# Patient Record
Sex: Female | Born: 1945
Health system: Southern US, Community
[De-identification: ages and names within clinical notes are randomized; demographics above are authoritative.]

## PROBLEM LIST (undated history)

## (undated) DIAGNOSIS — J309 Allergic rhinitis, unspecified: Secondary | ICD-10-CM

## (undated) DIAGNOSIS — I341 Nonrheumatic mitral (valve) prolapse: Secondary | ICD-10-CM

## (undated) DIAGNOSIS — T7840XA Allergy, unspecified, initial encounter: Secondary | ICD-10-CM

## (undated) DIAGNOSIS — I10 Essential (primary) hypertension: Secondary | ICD-10-CM

## (undated) HISTORY — DX: Nonrheumatic mitral (valve) prolapse: I34.1

## (undated) HISTORY — DX: Allergic rhinitis, unspecified: J30.9

## (undated) HISTORY — DX: Allergy, unspecified, initial encounter: T78.40XA

---
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
## 1974-02-14 HISTORY — PX: CERVICAL CONE BIOPSY: SUR198

## 2000-05-01 ENCOUNTER — Other Ambulatory Visit: Admission: RE | Admit: 2000-05-01 | Discharge: 2000-05-01 | Payer: Self-pay | Admitting: Gynecology

## 2005-02-14 HISTORY — PX: HEMORRHOID SURGERY: SHX153

## 2005-08-15 ENCOUNTER — Ambulatory Visit: Payer: Self-pay | Admitting: Family Medicine

## 2005-09-15 ENCOUNTER — Ambulatory Visit: Payer: Self-pay | Admitting: Family Medicine

## 2005-09-22 ENCOUNTER — Other Ambulatory Visit: Admission: RE | Admit: 2005-09-22 | Discharge: 2005-09-22 | Payer: Self-pay | Admitting: Family Medicine

## 2005-09-22 ENCOUNTER — Ambulatory Visit: Payer: Self-pay | Admitting: Family Medicine

## 2005-10-06 LAB — HM MAMMOGRAPHY: HM Mammogram: NORMAL

## 2005-10-12 ENCOUNTER — Ambulatory Visit: Payer: Self-pay | Admitting: Gastroenterology

## 2005-12-25 LAB — HM COLONOSCOPY

## 2006-01-17 ENCOUNTER — Ambulatory Visit: Payer: Self-pay | Admitting: Family Medicine

## 2006-11-03 ENCOUNTER — Ambulatory Visit: Payer: Self-pay | Admitting: Family Medicine

## 2006-11-21 ENCOUNTER — Ambulatory Visit: Payer: Self-pay | Admitting: Family Medicine

## 2007-11-15 ENCOUNTER — Ambulatory Visit: Payer: Self-pay | Admitting: Family Medicine

## 2008-11-18 ENCOUNTER — Ambulatory Visit: Payer: Self-pay | Admitting: Family Medicine

## 2009-01-22 ENCOUNTER — Ambulatory Visit: Payer: Self-pay | Admitting: Family Medicine

## 2010-01-05 ENCOUNTER — Ambulatory Visit: Payer: Self-pay | Admitting: Family Medicine

## 2010-12-02 ENCOUNTER — Other Ambulatory Visit (INDEPENDENT_AMBULATORY_CARE_PROVIDER_SITE_OTHER): Payer: Medicare Other

## 2010-12-02 DIAGNOSIS — Z23 Encounter for immunization: Secondary | ICD-10-CM

## 2011-05-06 ENCOUNTER — Encounter: Payer: Self-pay | Admitting: Internal Medicine

## 2011-05-16 ENCOUNTER — Other Ambulatory Visit (HOSPITAL_COMMUNITY)
Admission: RE | Admit: 2011-05-16 | Discharge: 2011-05-16 | Disposition: A | Discharge status: Home / Self Care | Payer: Medicare Other | Source: Ambulatory Visit | Attending: Family Medicine | Admitting: Family Medicine

## 2011-05-16 ENCOUNTER — Ambulatory Visit (INDEPENDENT_AMBULATORY_CARE_PROVIDER_SITE_OTHER): Payer: Medicare Other | Admitting: Family Medicine

## 2011-05-16 ENCOUNTER — Encounter: Payer: Self-pay | Admitting: Family Medicine

## 2011-05-16 VITALS — BP 170/86 | HR 72 | Ht 62.0 in | Wt 163.0 lb

## 2011-05-16 DIAGNOSIS — R5383 Other fatigue: Secondary | ICD-10-CM

## 2011-05-16 DIAGNOSIS — Z0000 Encounter for general adult medical examination without abnormal findings: Secondary | ICD-10-CM

## 2011-05-16 DIAGNOSIS — M8580 Other specified disorders of bone density and structure, unspecified site: Secondary | ICD-10-CM

## 2011-05-16 DIAGNOSIS — Z13220 Encounter for screening for lipoid disorders: Secondary | ICD-10-CM

## 2011-05-16 DIAGNOSIS — R202 Paresthesia of skin: Secondary | ICD-10-CM

## 2011-05-16 DIAGNOSIS — M899 Disorder of bone, unspecified: Secondary | ICD-10-CM

## 2011-05-16 DIAGNOSIS — Z23 Encounter for immunization: Secondary | ICD-10-CM

## 2011-05-16 DIAGNOSIS — R209 Unspecified disturbances of skin sensation: Secondary | ICD-10-CM

## 2011-05-16 DIAGNOSIS — IMO0001 Reserved for inherently not codable concepts without codable children: Secondary | ICD-10-CM

## 2011-05-16 DIAGNOSIS — R32 Unspecified urinary incontinence: Secondary | ICD-10-CM

## 2011-05-16 DIAGNOSIS — R5381 Other malaise: Secondary | ICD-10-CM

## 2011-05-16 DIAGNOSIS — Z124 Encounter for screening for malignant neoplasm of cervix: Secondary | ICD-10-CM | POA: Insufficient documentation

## 2011-05-16 DIAGNOSIS — R030 Elevated blood-pressure reading, without diagnosis of hypertension: Secondary | ICD-10-CM

## 2011-05-16 LAB — CBC WITH DIFFERENTIAL/PLATELET
Basophils Absolute: 0 10*3/uL (ref 0.0–0.1)
Eosinophils Relative: 1 % (ref 0–5)
Lymphocytes Relative: 21 % (ref 12–46)
Lymphs Abs: 1.3 10*3/uL (ref 0.7–4.0)
MCV: 95.7 fL (ref 78.0–100.0)
Neutrophils Relative %: 70 % (ref 43–77)
Platelets: 274 10*3/uL (ref 150–400)
RBC: 4.37 MIL/uL (ref 3.87–5.11)
RDW: 13.1 % (ref 11.5–15.5)
WBC: 6.2 10*3/uL (ref 4.0–10.5)

## 2011-05-16 LAB — POCT URINALYSIS DIPSTICK
Glucose, UA: NEGATIVE
Urobilinogen, UA: NEGATIVE

## 2011-05-16 LAB — COMPREHENSIVE METABOLIC PANEL
ALT: 16 U/L (ref 0–35)
AST: 18 U/L (ref 0–37)
Albumin: 4.6 g/dL (ref 3.5–5.2)
Alkaline Phosphatase: 64 U/L (ref 39–117)
BUN: 18 mg/dL (ref 6–23)
Chloride: 104 mEq/L (ref 96–112)
Creat: 0.98 mg/dL (ref 0.50–1.10)
Potassium: 4.3 mEq/L (ref 3.5–5.3)

## 2011-05-16 NOTE — Progress Notes (Signed)
done

## 2011-05-16 NOTE — Progress Notes (Signed)
Susan Dean is a 66 y.o. female who presents for a complete physical.  She has the following concerns:  Having a leaking urinary leakage. Brown spots on her skin. UA showed 1+ leuks and trace blood. Tingling sensation in her right thigh x 3-4 months. Fatty deposit pockets wants you to look at and wants to know what she can do about it.  Sinus drainage with weather changes.  Coricidin works well for her, but if she doesn't "catch it in time", turns into bronchitis.  Urinary leakage--gradual, over several months.  Denies dysuria.  Leakage both with coughing/sneezing, and if bladder is full.  Tingling R lateral thigh.  Seems to be somewhat positional, worse when lying flat, better if leg propped up.  H/o some chronic mild back problems.  Only rarely gets stabbing pains in lower leg  Other than coming here for flu shots, she hasn't been seen in this office since 9/08, last CPE in 8/07  Health Maintenance Immunization History  Administered Date(s) Administered  . Influenza Split 11/18/2008, 01/05/2010, 12/02/2010  . Td 05/15/2005  . Zoster 01/22/2009   Last Pap smear: 2007 Last mammogram: 8/07 Last colonoscopy:12/25/05--hyperplastic polyp and internal hemorrhoids (treated with rubber band ligation) Last DEXA: she believes she had one approx 2004 or 2007, reportedly normal (no records in chart), although she thinks there may have been some thinning.  This was ordered through her GYN (so results are likely there).  She no longer sees them Dentist: not for 2 years Ophtho: 2 years ago Exercise:  None now, due to her busy work schedule  Past Medical History  Diagnosis Date  . Mitral valve prolapse 12/56  . Allergic rhinitis, cause unspecified     Past Surgical History  Procedure Date  . Cervical cone biopsy 75    [place]  . Hemorrhoid surgery 2007    rubber band ligation and infrared coagulation of internal hemorrhoids    History   Social History  . Marital Status: Married   Spouse Name: N/A    Number of Children: 3  . Years of Education: N/A   Occupational History  . 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 Smoking status: Never Smoker   . Smokeless tobacco: Never Used  . Alcohol Use: Yes     very seldom  . Drug Use: No  . Sexually Active: Yes -- Female partner(s)   Other Topics Concern  . Not on file   Social History Narrative   Lives with husband.  2 children live in [place], 1 in [place].     Family History  Problem Relation Age of Onset  . Heart disease Mother   . Hypertension Mother   . Diabetes Mother   . Heart disease Father     CABG in mid-late 60's  . Hypertension Father   . Bipolar disorder Daughter   . Thyroid disease Daughter   . Cancer Maternal Aunt     breast cancer  . Hypertension Paternal Aunt     Current outpatient prescriptions:Ascorbic Acid (VITAMIN C DROPS MT), Use as directed 1 drop in the mouth or throat daily., Disp: , Rfl: ;  Calcium Carbonate-Vitamin D 600-400 MG-UNIT per chew tablet, Chew 2 tablets by mouth daily., Disp: , Rfl: ;  Dextromethorphan-Guaifenesin (CORICIDIN HBP CONGESTION/COUGH) 10-200 MG CAPS, Take 1 capsule by mouth 4 (four) times daily as needed., Disp: , Rfl:  Multiple Vitamins-Minerals (MULTIVITAMIN WITH MINERALS) tablet, Take 1 tablet by mouth daily., Disp: ,  Rfl:   Allergies  Allergen Reactions  . Erythrocin Other (See Comments)    Swelling and puffiness.   ROS:  The patient denies anorexia, fever, weight changes, headaches,  vision changes, decreased hearing, ear pain, sore throat, breast concerns, chest pain, palpitations, dizziness, syncope, dyspnea on exertion, cough, swelling, nausea, vomiting, diarrhea, constipation, abdominal pain, melena, hematochezia, indigestion/heartburn, hematuria, dysuria, vaginal bleeding, discharge, odor or itch, genital lesions, joint pains, numbness, tingling, weakness, tremor, suspicious skin lesions, depression, anxiety,  abnormal bleeding/bruising, or enlarged lymph nodes. See HPI/chief complaint.  PHYSICAL EXAM: BP 162/94  Pulse 72  Ht 5\' 2"  (1.575 m)  Wt 163 lb (73.936 kg)  BMI 29.81 kg/m2 170/86 on repeat by MD General Appearance:    Alert, cooperative, no distress, appears stated age, talkative  Head:    Normocephalic, without obvious abnormality, atraumatic  Eyes:    PERRL, conjunctiva/corneas clear, EOM's intact, fundi    benign  Ears:    Normal TM's and external ear canals  Nose:   Nares normal, mucosa normal, no drainage or sinus   tenderness  Throat:   Lips, mucosa, and tongue normal; teeth and gums normal  Neck:   Supple, no lymphadenopathy;  thyroid:  no   enlargement/tenderness/nodules; no carotid   bruit or JVD  Back:    Spine nontender, no curvature, ROM normal, no CVA     tenderness  Lungs:     Clear to auscultation bilaterally without wheezes, rales or     ronchi; respirations unlabored  Chest Wall:    No tenderness or deformity   Heart:    Regular rate and rhythm, S1 and S2 normal, no murmur, rub   or gallop  Breast Exam:    No tenderness, masses, or nipple discharge or inversion.      No axillary lymphadenopathy  Abdomen:     Soft, non-tender, nondistended, normoactive bowel sounds,    no masses, no hepatosplenomegaly  Genitalia:    Normal external genitalia without lesions.  BUS and vagina normal; cervix without lesions, or cervical motion tenderness, very small cervical polyp at os (right side). No abnormal vaginal discharge.  Uterus and adnexa not enlarged, nontender, no masses.  Pap performed  Rectal:    Normal tone, no masses or tenderness; guaiac negative stool  Extremities:   No clubbing, cyanosis or edema  Pulses:   2+ and symmetric all extremities  Skin:   Skin color, texture, turgor normal, no rashes or lesions. Fatty subcutaneous masses both arms, soft, nontender.  Many seborrheic keratoses, noninflamed, and angiomas throughout.   Lymph nodes:   Cervical, supraclavicular,  and axillary nodes normal  Neurologic:   CNII-XII intact, normal strength, sensation and gait; reflexes 2+ and symmetric throughout          Psych:   Normal mood, affect, hygiene and grooming.    ASSESSMENT/PLAN: 1. Routine general medical examination at a health care facility  POCT Urinalysis Dipstick, Visual acuity screening, Cytology - PAP  2. Paresthesias  Comprehensive metabolic panel, TSH, Vitamin B12   suspect meralgia paresthetica  3. Screening for lipoid disorders    4. Other malaise and fatigue  CBC with Differential, Comprehensive metabolic panel, TSH, Vitamin B12  5. Need for Tdap vaccination  Tdap vaccine greater than or equal to 7yo IM  6. Need for pneumococcal vaccination  Pneumococcal polysaccharide vaccine 23-valent greater than or equal to 2yo subcutaneous/IM  7. Osteopenia  Vitamin D 25 hydroxy  8. Medicare annual wellness visit, initial    9. Incontinence  Urine Culture  10. Elevated BP     Elevated BP--low sodium diet, check BP elsewhere. Regular exercise, weight loss.  F/u if bp's remain elevated  Paresthesias--probably meralgia paresthetica, although can't r/o lumbar radiculopathy given h/o back problems. Check routine labs.  Urinary incontinence, mixed: Kegels, behavioral techniques reviewed in detail Send urine culture to r/o infection.  Get records from GYN with last DEXA results to decide if repeat is needed.  If any osteopenia present, then recommend repeat.  Discussed monthly self breast exams and yearly mammograms (she is past due); at least 30 minutes of aerobic activity at least 5 days/week; proper sunscreen use reviewed; healthy diet, including goals of calcium and vitamin D intake and alcohol recommendations (less than or equal to 1 drink/day) reviewed; regular seatbelt use; changing batteries in smoke detectors.  Immunization recommendations discussed--tdap and pneumovax given.  Colonoscopy recommendations reviewed--hemoccult kit given.  Colonoscopy  UTD

## 2011-05-16 NOTE — Patient Instructions (Addendum)
HEALTH MAINTENANCE RECOMMENDATIONS:  It is recommended that you get at least 30 minutes of aerobic exercise at least 5 days/week (for weight loss, you may need as much as 60-90 minutes). This can be any activity that gets your heart rate up. This can be divided in 10-15 minute intervals if needed, but try and build up your endurance at least once a week.  Weight bearing exercise is also recommended twice weekly.  Eat a healthy diet with lots of vegetables, fruits and fiber.  "Colorful" foods have a lot of vitamins (ie green vegetables, tomatoes, red peppers, etc).  Limit sweet tea, regular sodas and alcoholic beverages, all of which has a lot of calories and sugar.  Up to 1 alcoholic drink daily may be beneficial for women (unless trying to lose weight, watch sugars).  Drink a lot of water.  Calcium recommendations are 1200-1500 mg daily (1500 mg for postmenopausal women or women without ovaries), and vitamin D 1000 IU daily.  This should be obtained from diet and/or supplements (vitamins), and calcium should not be taken all at once, but in divided doses.  Monthly self breast exams and yearly mammograms for women over the age of 36 is recommended.  Sunscreen of at least SPF 30 should be used on all sun-exposed parts of the skin when outside between the hours of 10 am and 4 pm (not just when at beach or pool, but even with exercise, golf, tennis, and yard work!)  Use a sunscreen that says "broad spectrum" so it covers both UVA and UVB rays, and make sure to reapply every 1-2 hours.  Remember to change the batteries in your smoke detectors when changing your clock times in the spring and fall.  Use your seat belt every time you are in a car, and please drive safely and not be distracted with cell phones and texting while driving.  Please check your blood pressure elsewhere, write down, and follow up here if your BP remains >140/90.  Ideally, should be <130/80.  2 Gram Low Sodium Diet A 2 gram  sodium diet restricts the amount of sodium in the diet to no more than 2 g or 2000 mg daily. Limiting the amount of sodium is often used to help lower blood pressure. It is important if you have heart, liver, or kidney problems. Many foods contain sodium for flavor and sometimes as a preservative. When the amount of sodium in a diet needs to be low, it is important to know what to look for when choosing foods and drinks. The following includes some information and guidelines to help make it easier for you to adapt to a low sodium diet. QUICK TIPS  Do not add salt to food.   Avoid convenience items and fast food.   Choose unsalted snack foods.   Buy lower sodium products, often labeled as "lower sodium" or "no salt added."   Check food labels to learn how much sodium is in 1 serving.   When eating at a restaurant, ask that your food be prepared with less salt or none, if possible.  READING FOOD LABELS FOR SODIUM INFORMATION The nutrition facts label is a good place to find how much sodium is in foods. Look for products with no more than 500 to 600 mg of sodium per meal and no more than 150 mg per serving. Remember that 2 g = 2000 mg. The food label may also list foods as:  Sodium-free: Less than 5 mg in a serving.  Very low sodium: 35 mg or less in a serving.   Low-sodium: 140 mg or less in a serving.   Light in sodium: 50% less sodium in a serving. For example, if a food that usually has 300 mg of sodium is changed to become light in sodium, it will have 150 mg of sodium.   Reduced sodium: 25% less sodium in a serving. For example, if a food that usually has 400 mg of sodium is changed to reduced sodium, it will have 300 mg of sodium.  CHOOSING FOODS Grains  Avoid: Salted crackers and snack items. Some cereals, including instant hot cereals. Bread stuffing and biscuit mixes. Seasoned rice or pasta mixes.   Choose: Unsalted snack items. Low-sodium cereals, oats, puffed wheat and  rice, shredded wheat. English muffins and bread. Pasta.  Meats  Avoid: Salted, canned, smoked, spiced, pickled meats, including fish and poultry. Bacon, ham, sausage, cold cuts, hot dogs, anchovies.   Choose: Low-sodium canned tuna and salmon. Fresh or frozen meat, poultry, and fish.  Dairy  Avoid: Processed cheese and spreads. Cottage cheese. Buttermilk and condensed milk. Regular cheese.   Choose: Milk. Low-sodium cottage cheese. Yogurt. Sour cream. Low-sodium cheese.  Fruits and Vegetables  Avoid: Regular canned vegetables. Regular canned tomato sauce and paste. Frozen vegetables in sauces. Olives. Rosita Fire. Relishes. Sauerkraut.   Choose: Low-sodium canned vegetables. Low-sodium tomato sauce and paste. Frozen or fresh vegetables. Fresh and frozen fruit.  Condiments  Avoid: Canned and packaged gravies. Worcestershire sauce. Tartar sauce. Barbecue sauce. Soy sauce. Steak sauce. Ketchup. Onion, garlic, and table salt. Meat flavorings and tenderizers.   Choose: Fresh and dried herbs and spices. Low-sodium varieties of mustard and ketchup. Lemon juice. Tabasco sauce. Horseradish.  SAMPLE 2 GRAM SODIUM MEAL PLAN Breakfast / Sodium (mg)  1 cup low-fat milk / 143 mg   2 slices whole-wheat toast / 270 mg   1 tbs heart-healthy margarine / 153 mg   1 hard-boiled egg / 139 mg   1 small orange / 0 mg  Lunch / Sodium (mg)  1 cup raw carrots / 76 mg    cup hummus / 298 mg   1 cup low-fat milk / 143 mg    cup red grapes / 2 mg   1 whole-wheat pita bread / 356 mg  Dinner / Sodium (mg)  1 cup whole-wheat pasta / 2 mg   1 cup low-sodium tomato sauce / 73 mg   3 oz lean ground beef / 57 mg   1 small side salad (1 cup raw spinach leaves,  cup cucumber,  cup yellow bell pepper) with 1 tsp olive oil and 1 tsp red wine vinegar / 25 mg  Snack / Sodium (mg)  1 container low-fat vanilla yogurt / 107 mg   3 graham cracker squares / 127 mg  Nutrient Analysis  Calories: 2033    Protein: 77 g   Carbohydrate: 282 g   Fat: 72 g   Sodium: 1971 mg  Document Released: 01/31/2005 Document Revised: 01/20/2011 Document Reviewed: 05/04/2009 [facility] Patient Information 2012 [place], [place].

## 2011-05-17 LAB — VITAMIN D 25 HYDROXY (VIT D DEFICIENCY, FRACTURES): Vit D, 25-Hydroxy: 28 ng/mL — ABNORMAL LOW (ref 30–89)

## 2011-05-18 ENCOUNTER — Other Ambulatory Visit: Payer: Self-pay | Admitting: *Deleted

## 2011-05-18 DIAGNOSIS — N390 Urinary tract infection, site not specified: Secondary | ICD-10-CM

## 2011-05-18 LAB — URINE CULTURE: Colony Count: >=100000

## 2011-05-18 MED ORDER — NITROFURANTOIN MONOHYD MACRO 100 MG PO CAPS: 100.0000 mg | ORAL_CAPSULE | Freq: Two times a day (BID) | ORAL | Status: DC

## 2011-06-02 ENCOUNTER — Other Ambulatory Visit (INDEPENDENT_AMBULATORY_CARE_PROVIDER_SITE_OTHER): Payer: Medicare Other

## 2011-06-02 DIAGNOSIS — N390 Urinary tract infection, site not specified: Secondary | ICD-10-CM

## 2011-06-02 DIAGNOSIS — Z1211 Encounter for screening for malignant neoplasm of colon: Secondary | ICD-10-CM

## 2011-06-02 DIAGNOSIS — Z0000 Encounter for general adult medical examination without abnormal findings: Secondary | ICD-10-CM

## 2011-06-02 LAB — POCT URINALYSIS DIPSTICK
Leukocytes, UA: NEGATIVE
Nitrite, UA: NEGATIVE
Protein, UA: NEGATIVE
pH, UA: 5

## 2011-06-02 LAB — HEMOCCULT GUIAC POC 1CARD (OFFICE)
Card #2 Date: 4122013
Card #2 Fecal Occult Blod, POC: NEGATIVE
Card #3 Date: 4122013

## 2011-06-02 NOTE — Progress Notes (Signed)
Addended by: Debbrah Alar F on: 06/02/2011 04:27 PM   Modules accepted: Orders

## 2011-12-16 ENCOUNTER — Other Ambulatory Visit: Payer: Medicare Other

## 2011-12-16 DIAGNOSIS — Z23 Encounter for immunization: Secondary | ICD-10-CM

## 2011-12-16 MED ORDER — INFLUENZA VIRUS VACC SPLIT PF IM SUSP: 0.5000 mL | Freq: Once | INTRAMUSCULAR | Status: DC

## 2012-07-17 ENCOUNTER — Ambulatory Visit (INDEPENDENT_AMBULATORY_CARE_PROVIDER_SITE_OTHER): Payer: Medicare Other | Admitting: Family Medicine

## 2012-07-17 ENCOUNTER — Encounter: Payer: Self-pay | Admitting: Family Medicine

## 2012-07-17 VITALS — Wt 155.0 lb

## 2012-07-17 DIAGNOSIS — L259 Unspecified contact dermatitis, unspecified cause: Secondary | ICD-10-CM

## 2012-07-17 NOTE — Progress Notes (Signed)
  Subjective:    Patient ID: Susan Dean, female    DOB: 01-15-1946, 67 y.o.   MRN: [id number]  HPI Se has had difficulty over the last several weeks with scattered lesions after working in the yard. She did this several days ago and now has a rather large lesion on the right distal medial forearm. She has used soap and water.   Review of Systems     Objective:   Physical Exam Scattered erythematous vesicular lesions somewhat linear in nature are noted on her arms and a rather large 3 x 5 cm erythematous lesion with central vesicles is noted on the right distal medial forearm       Assessment & Plan:  Contact dermatitis  recommending conservative care with cortisone cream as well as occlusive type dressings. Benadryl at night. Discussed the mechanism of how this is spread. She has washed several times with soap and water. Explained that it has not spread but delayed in reaction.

## 2012-07-17 NOTE — Patient Instructions (Signed)

## 2012-07-19 ENCOUNTER — Telehealth: Payer: Self-pay | Admitting: Internal Medicine

## 2012-07-19 MED ORDER — METHYLPREDNISOLONE 4 MG PO KIT: PACK | ORAL | Status: DC

## 2012-07-19 NOTE — Telephone Encounter (Signed)
Sent med in pt is aware of side affects and steroid

## 2012-07-19 NOTE — Telephone Encounter (Signed)
Okay for medrol dosepak.  Ensure that she is aware of side effects of steroids--may cause insomnia, weight gain, mood changes, but just temporarily at the higher doses for the first few days.  She can continue to use antihistamines for itching (benadryl, zyrtec), oatmeal baths.  Follow up here if ongoing/worsening symptoms

## 2012-07-19 NOTE — Telephone Encounter (Signed)
Pt states she was seen a few days ago by Dr. Susann Givens for possible poision ivy. But pt states it has spread all over her hangds, neck, trunk of body and would like it sent to [facility] on [address]

## 2012-07-28 ENCOUNTER — Ambulatory Visit (INDEPENDENT_AMBULATORY_CARE_PROVIDER_SITE_OTHER): Payer: Medicare Other | Admitting: Family Medicine

## 2012-07-28 VITALS — BP 168/94 | HR 92 | Temp 97.9°F | Resp 16 | Ht 64.0 in | Wt 155.0 lb

## 2012-07-28 DIAGNOSIS — J309 Allergic rhinitis, unspecified: Secondary | ICD-10-CM

## 2012-07-28 DIAGNOSIS — L255 Unspecified contact dermatitis due to plants, except food: Secondary | ICD-10-CM

## 2012-07-28 DIAGNOSIS — L259 Unspecified contact dermatitis, unspecified cause: Secondary | ICD-10-CM

## 2012-07-28 DIAGNOSIS — L237 Allergic contact dermatitis due to plants, except food: Secondary | ICD-10-CM

## 2012-07-28 MED ORDER — PREDNISONE 20 MG PO TABS: ORAL_TABLET | ORAL | Status: DC

## 2012-07-28 MED ORDER — TRIAMCINOLONE ACETONIDE 0.1 % EX CREA: TOPICAL_CREAM | Freq: Two times a day (BID) | CUTANEOUS | Status: DC

## 2012-07-28 NOTE — Patient Instructions (Addendum)
Poison Ivy Poison ivy is a inflammation of the skin (contact dermatitis) caused by touching the allergens on the leaves of the ivy plant following previous exposure to the plant. The rash usually appears 48 hours after exposure. The rash is usually bumps (papules) or blisters (vesicles) in a linear pattern. Depending on your own sensitivity, the rash may simply cause redness and itching, or it may also progress to blisters which may break open. These must be well cared for to prevent secondary bacterial (germ) infection, followed by scarring. Keep any open areas dry, clean, dressed, and covered with an antibacterial ointment if needed. The eyes may also get puffy. The puffiness is worst in the morning and gets better as the day progresses. This dermatitis usually heals without scarring, within 2 to 3 weeks without treatment. HOME CARE INSTRUCTIONS  Thoroughly wash with soap and water as soon as you have been exposed to poison ivy. You have about one half hour to remove the plant resin before it will cause the rash. This washing will destroy the oil or antigen on the skin that is causing, or will cause, the rash. Be sure to wash under your fingernails as any plant resin there will continue to spread the rash. Do not rub skin vigorously when washing affected area. Poison ivy cannot spread if no oil from the plant remains on your body. A rash that has progressed to weeping sores will not spread the rash unless you have not washed thoroughly. It is also important to wash any clothes you have been wearing as these may carry active allergens. The rash will return if you wear the unwashed clothing, even several days later. Avoidance of the plant in the future is the best measure. Poison ivy plant can be recognized by the number of leaves. Generally, poison ivy has three leaves with flowering branches on a single stem. Diphenhydramine may be purchased over the counter and used as needed for itching. Do not drive with  this medication if it makes you drowsy.Ask your caregiver about medication for children. SEEK MEDICAL CARE IF:  Open sores develop.  Redness spreads beyond area of rash.  You notice purulent (pus-like) discharge.  You have increased pain.  Other signs of infection develop (such as fever). Document Released: 01/29/2000 Document Revised: 04/25/2011 Document Reviewed: 12/17/2008 [employer] Patient Information 2014 [employer].  
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
## 2012-12-04 ENCOUNTER — Other Ambulatory Visit (INDEPENDENT_AMBULATORY_CARE_PROVIDER_SITE_OTHER): Payer: Medicare Other

## 2012-12-04 DIAGNOSIS — Z23 Encounter for immunization: Secondary | ICD-10-CM

## 2013-02-19 ENCOUNTER — Encounter: Payer: Self-pay | Admitting: Family Medicine

## 2013-02-19 ENCOUNTER — Ambulatory Visit (INDEPENDENT_AMBULATORY_CARE_PROVIDER_SITE_OTHER): Payer: Medicare Other | Admitting: Family Medicine

## 2013-02-19 VITALS — BP 170/92 | HR 79 | Wt 157.0 lb

## 2013-02-19 DIAGNOSIS — J019 Acute sinusitis, unspecified: Secondary | ICD-10-CM

## 2013-02-19 MED ORDER — AMOXICILLIN 875 MG PO TABS: 875.0000 mg | ORAL_TABLET | Freq: Two times a day (BID) | ORAL | Status: DC

## 2013-02-19 NOTE — Progress Notes (Signed)
   Subjective:    Patient ID: Susan Dean, female    DOB: [birth year], 68 y.o.   MRN: [id number]  HPI Approximately 6 days ago she had difficulty with nasal congestion and PND with fever or 2 days later she had worsening of her PND and difficulty with left-sided nasal bleeding no upper tooth discomfort. Usually by this time, with a URI she is feeling better.   Review of Systems     Objective:   Physical Exam alert and in no distress. Tympanic membranes and canals are normal. Throat is clear. Tonsils are normal. Neck is supple without adenopathy or thyromegaly. Cardiac exam shows a regular sinus rhythm without murmurs or gallops. Lungs are clear to auscultation. Nasal mucosa is red with tenderness over sinuses       Assessment & Plan:  Acute sinusitis - Plan: amoxicillin (AMOXIL) 875 MG tablet  She is to call if not entirely better when she finishes the course of the antibiotic.

## 2013-07-02 ENCOUNTER — Ambulatory Visit (INDEPENDENT_AMBULATORY_CARE_PROVIDER_SITE_OTHER): Payer: Medicare Other | Admitting: Family Medicine

## 2013-07-02 VITALS — BP 162/80 | HR 89 | Temp 99.3°F | Resp 16 | Ht 61.0 in | Wt 154.0 lb

## 2013-07-02 DIAGNOSIS — R0982 Postnasal drip: Secondary | ICD-10-CM

## 2013-07-02 DIAGNOSIS — J019 Acute sinusitis, unspecified: Secondary | ICD-10-CM

## 2013-07-02 DIAGNOSIS — J329 Chronic sinusitis, unspecified: Secondary | ICD-10-CM

## 2013-07-02 DIAGNOSIS — J040 Acute laryngitis: Secondary | ICD-10-CM

## 2013-07-02 MED ORDER — AMOXICILLIN 875 MG PO TABS: 875.0000 mg | ORAL_TABLET | Freq: Two times a day (BID) | ORAL | Status: DC

## 2013-07-02 NOTE — Progress Notes (Signed)
Chief Complaint:  Chief Complaint  Patient presents with  . Sinusitis    drainage,congestion, cough x 5 days    HPI: Susan Dean is a 68 y.o. female who is here for  5 day history of allergy and sinus sytmoms. She had chills on Thuersday night. She takes no medicine. She took meds on Friday night, chlorosetin and cough syrup and EmergenC. Sat in night air and made it worse . She has chills and feels all stopped up. She has tried zyrtec and did not help her. NO CP, SOB, wheezing.   Past Medical History  Diagnosis Date  . Mitral valve prolapse 12/56  . Allergic rhinitis, cause unspecified   . Allergy    Past Surgical History  Procedure Laterality Date  . Cervical cone biopsy  76    [place]  . Hemorrhoid surgery  2007    rubber band ligation and infrared coagulation of internal hemorrhoids   History   Social History  . Marital Status: Married    Spouse Name: N/A    Number of Children: 3  . Years of Education: N/A   Occupational History  . 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 Smoking status: Never Smoker   . Smokeless tobacco: Never Used  . Alcohol Use: Yes     Comment: very seldom  . Drug Use: No  . Sexual Activity: Yes    Partners: Male   Other Topics Concern  . None   Social History Narrative   Lives with husband.  2 children live in [place], 1 in [place].    Family History  Problem Relation Age of Onset  . Heart disease Mother   . Hypertension Mother   . Diabetes Mother   . Heart disease Father     CABG in mid-late 60's  . Hypertension Father   . Bipolar disorder Daughter   . Thyroid disease Daughter   . Cancer Maternal Aunt     breast cancer  . Hypertension Paternal Aunt    Allergies  Allergen Reactions  . Erythrocin Other (See Comments)    Swelling and puffiness.   Prior to Admission medications   Medication Sig Start Date End Date Taking? Authorizing Provider  Ascorbic Acid (VITAMIN C DROPS MT)  Use as directed 1 drop in the mouth or throat daily.   Yes Historical Provider, MD  Multiple Vitamins-Minerals (MULTIVITAMIN WITH MINERALS) tablet Take 1 tablet by mouth daily.   Yes Historical Provider, MD     ROS: The patient denies  night sweats, unintentional weight loss, chest pain, palpitations, wheezing, dyspnea on exertion, nausea, vomiting, abdominal pain, dysuria, hematuria, melena, numbness, weakness, or tingling.   All other systems have been reviewed and were otherwise negative with the exception of those mentioned in the HPI and as above.    PHYSICAL EXAM: Filed Vitals:   07/02/13 1450  BP: 162/80  Pulse: 89  Temp: 99.3 F (37.4 C)  Resp: 16   Filed Vitals:   07/02/13 1450  Height: 5\' 1"  (1.549 m)  Weight: 154 lb (69.854 kg)   Body mass index is 29.11 kg/(m^2).  General: Alert, no acute distress HEENT:  Normocephalic, atraumatic, oropharynx patent. EOMI, PERRLA, TM nl. + sinus tenderness, no exudates.  Cardiovascular:  Regular rate and rhythm, no rubs murmurs or gallops.  No Carotid bruits, radial pulse intact. No pedal edema.  Respiratory: Clear to auscultation bilaterally.  No wheezes, rales, or rhonchi.  No cyanosis, no use of accessory musculature GI: No organomegaly, abdomen is soft and non-tender, positive bowel sounds.  No masses. Skin: No rashes. Neurologic: Facial musculature symmetric. Psychiatric: Patient is appropriate throughout our interaction. Lymphatic: No cervical lymphadenopathy Musculoskeletal: Gait intact.   LABS: Results for orders placed in visit on 06/02/11  HEMOCCULT GUIAC POC 1CARD (OFFICE)      Result Value Ref Range   Fecal Occult Blood, POC Negative     Card #1 Date 4122013     Card #2 Fecal Occult Blod, POC Negative     Card #2 Date 4122013     Card #3 Fecal Occult Blood, POC Negative     Card #3 Date [id number]    POCT URINALYSIS DIPSTICK      Result Value Ref Range   Color, UA yellow     Clarity, UA clear     Glucose, UA neg      Bilirubin, UA neg     Ketones, UA neg     Spec Grav, UA 1.025     Blood, UA neg     pH, UA 5.0     Protein, UA neg     Urobilinogen, UA negative     Nitrite, UA neg     Leukocytes, UA Negative       EKG/XRAY:   Primary read interpreted [facility]   ASSESSMENT/PLAN: Encounter Diagnoses  Name Primary?  . Sinusitis Yes  . PND (post-nasal drip)   . Laryngitis   . Acute sinusitis    Rx amoxacaillin Recommend Claritin withut D She does  Not have any h.o HTN, she has white coat syndrome She can take nasocort otc prn F/u prn  Gross sideeffects, risk and benefits, and alternatives of medications d/w patient. Patient is aware that all medications have potential sideeffects and we are unable to predict every sideeffect or drug-drug interaction that may occur.  Lenell Antuhao P Lankford Gutzmer, DO 07/02/2013 3:59 PM

## 2013-07-02 NOTE — Patient Instructions (Signed)

## 2013-07-03 ENCOUNTER — Ambulatory Visit: Payer: Medicare Other | Admitting: Medical

## 2013-12-13 ENCOUNTER — Other Ambulatory Visit (INDEPENDENT_AMBULATORY_CARE_PROVIDER_SITE_OTHER): Payer: Medicare Other

## 2013-12-13 DIAGNOSIS — Z23 Encounter for immunization: Secondary | ICD-10-CM

## 2014-12-24 ENCOUNTER — Emergency Department (HOSPITAL_COMMUNITY)
Admission: EM | Admit: 2014-12-24 | Discharge: 2014-12-24 | Disposition: A | Discharge status: Home / Self Care | Payer: Medicare Other | Attending: Emergency Medicine | Admitting: Emergency Medicine

## 2014-12-24 ENCOUNTER — Encounter (HOSPITAL_COMMUNITY): Payer: Self-pay | Admitting: Emergency Medicine

## 2014-12-24 ENCOUNTER — Emergency Department (HOSPITAL_COMMUNITY): Payer: Medicare Other

## 2014-12-24 DIAGNOSIS — Z8679 Personal history of other diseases of the circulatory system: Secondary | ICD-10-CM | POA: Diagnosis not present

## 2014-12-24 DIAGNOSIS — Z8709 Personal history of other diseases of the respiratory system: Secondary | ICD-10-CM | POA: Diagnosis not present

## 2014-12-24 DIAGNOSIS — R748 Abnormal levels of other serum enzymes: Secondary | ICD-10-CM | POA: Diagnosis not present

## 2014-12-24 DIAGNOSIS — N201 Calculus of ureter: Secondary | ICD-10-CM | POA: Diagnosis not present

## 2014-12-24 DIAGNOSIS — R109 Unspecified abdominal pain: Secondary | ICD-10-CM | POA: Diagnosis present

## 2014-12-24 LAB — CBC
HEMATOCRIT: 42.3 % (ref 36.0–46.0)
HEMOGLOBIN: 14.1 g/dL (ref 12.0–15.0)
MCH: 32.3 pg (ref 26.0–34.0)
MCHC: 33.3 g/dL (ref 30.0–36.0)
MCV: 97 fL (ref 78.0–100.0)
Platelets: 243 10*3/uL (ref 150–400)
RBC: 4.36 MIL/uL (ref 3.87–5.11)
RDW: 12.6 % (ref 11.5–15.5)
WBC: 6.8 10*3/uL (ref 4.0–10.5)

## 2014-12-24 LAB — URINE MICROSCOPIC-ADD ON

## 2014-12-24 LAB — URINALYSIS, ROUTINE W REFLEX MICROSCOPIC
BILIRUBIN URINE: NEGATIVE
GLUCOSE, UA: NEGATIVE mg/dL
KETONES UR: NEGATIVE mg/dL
LEUKOCYTES UA: NEGATIVE
Nitrite: NEGATIVE
PH: 7 (ref 5.0–8.0)
PROTEIN: NEGATIVE mg/dL
Specific Gravity, Urine: 1.012 (ref 1.005–1.030)
UROBILINOGEN UA: 0.2 mg/dL (ref 0.0–1.0)

## 2014-12-24 LAB — COMPREHENSIVE METABOLIC PANEL
ALBUMIN: 4.4 g/dL (ref 3.5–5.0)
ALT: 18 U/L (ref 14–54)
ANION GAP: 10 (ref 5–15)
AST: 23 U/L (ref 15–41)
Alkaline Phosphatase: 75 U/L (ref 38–126)
BILIRUBIN TOTAL: 0.9 mg/dL (ref 0.3–1.2)
BUN: 13 mg/dL (ref 6–20)
CO2: 24 mmol/L (ref 22–32)
Calcium: 9.4 mg/dL (ref 8.9–10.3)
Chloride: 107 mmol/L (ref 101–111)
Creatinine, Ser: 1.07 mg/dL — ABNORMAL HIGH (ref 0.44–1.00)
GFR calc Af Amer: 60 mL/min — ABNORMAL LOW (ref >60)
GFR calc non Af Amer: 51 mL/min — ABNORMAL LOW (ref >60)
GLUCOSE: 133 mg/dL — AB (ref 65–99)
POTASSIUM: 3.3 mmol/L — AB (ref 3.5–5.1)
SODIUM: 141 mmol/L (ref 135–145)
TOTAL PROTEIN: 7.4 g/dL (ref 6.5–8.1)

## 2014-12-24 LAB — LIPASE, BLOOD: Lipase: 162 U/L — ABNORMAL HIGH (ref 11–51)

## 2014-12-24 MED ORDER — ONDANSETRON HCL 4 MG/2ML IJ SOLN
4.0000 mg | Freq: Once | INTRAMUSCULAR | Status: AC | PRN
  Administered 2014-12-24: 4 mg via INTRAVENOUS
  Filled 2014-12-24: qty 2

## 2014-12-24 MED ORDER — OXYCODONE-ACETAMINOPHEN 5-325 MG PO TABS: 1.0000 | ORAL_TABLET | Freq: Four times a day (QID) | ORAL | Status: DC | PRN

## 2014-12-24 MED ORDER — ONDANSETRON 4 MG PO TBDP: 4.0000 mg | ORAL_TABLET | Freq: Three times a day (TID) | ORAL | Status: DC | PRN

## 2014-12-24 MED ORDER — IOHEXOL 300 MG/ML  SOLN
50.0000 mL | Freq: Once | INTRAMUSCULAR | Status: DC | PRN
  Administered 2014-12-24: 50 mL via ORAL
  Filled 2014-12-24: qty 50

## 2014-12-24 MED ORDER — TAMSULOSIN HCL 0.4 MG PO CAPS: 0.4000 mg | ORAL_CAPSULE | Freq: Every day | ORAL | Status: DC

## 2014-12-24 MED ORDER — FENTANYL CITRATE (PF) 100 MCG/2ML IJ SOLN
50.0000 ug | Freq: Once | INTRAMUSCULAR | Status: AC
  Administered 2014-12-24: 50 ug via INTRAVENOUS
  Filled 2014-12-24: qty 2

## 2014-12-24 MED ORDER — IOHEXOL 300 MG/ML  SOLN
100.0000 mL | Freq: Once | INTRAMUSCULAR | Status: AC | PRN
  Administered 2014-12-24: 100 mL via INTRAVENOUS

## 2014-12-24 MED ORDER — ONDANSETRON HCL 4 MG/2ML IJ SOLN
4.0000 mg | Freq: Once | INTRAMUSCULAR | Status: AC
  Filled 2014-12-24: qty 2

## 2014-12-24 NOTE — ED Notes (Signed)
Patient is aware we need urine, labeled cup at bedside

## 2014-12-24 NOTE — ED Notes (Signed)
Pt states RLQ pain starting this morning around 1000, causing her to be more gassy, which helps to relieve some of the pain. Has vomited twice since this started, feels nauseous, and is having "explosive bowel movements." Appears to be in obvious pain in triage.

## 2014-12-24 NOTE — ED Notes (Signed)
NP at bedside.

## 2014-12-24 NOTE — ED Provider Notes (Signed)
CSN: [id number]     Arrival date & time 12/24/14  1156 History   First MD Initiated Contact with Patient 12/24/14 1242     Chief Complaint  Patient presents with  . Abdominal Pain  . Emesis  . Nausea     (Consider location/radiation/quality/duration/timing/severity/associated sxs/prior Treatment) HPI Comments: Pt comes in with c/o abdominal pain that started this morning. No fever or diarrhea. Does have some  Vomiting. No similar history. She states that she feels like she needs to have bowel movement and it would feel better. No previous abdominal surgeries. No dysuria.   The history is provided by the patient. No language interpreter was used.    Past Medical History  Diagnosis Date  . Mitral valve prolapse 12/56  . Allergic rhinitis, cause unspecified   . Allergy    Past Surgical History  Procedure Laterality Date  . Cervical cone biopsy  76    [place]  . Hemorrhoid surgery  2007    rubber band ligation and infrared coagulation of internal hemorrhoids   Family History  Problem Relation Age of Onset  . Heart disease Mother   . Hypertension Mother   . Diabetes Mother   . Heart disease Father     CABG in mid-late 60's  . Hypertension Father   . Bipolar disorder Daughter   . Thyroid disease Daughter   . Cancer Maternal Aunt     breast cancer  . Hypertension Paternal Aunt    Social History  Substance Use Topics  . Smoking status: Never Smoker   . Smokeless tobacco: Never Used  . Alcohol Use: Yes     Comment: very seldom   OB History    No data available     Review of Systems  All other systems reviewed and are negative.     Allergies  Erythrocin  Home Medications   Prior to Admission medications   Medication Sig Start Date End Date Taking? Authorizing Provider  Multiple Vitamins-Minerals (EMERGEN-C IMMUNE) PACK Take 1 each by mouth once.   Yes Historical Provider, MD  amoxicillin (AMOXIL) 875 MG tablet Take 1 tablet (875 mg total) by mouth 2 (two)  times daily. Patient not taking: Reported on 12/24/2014 07/02/13   Thao P Le, DO   BP 181/101 mmHg  Pulse 76  Temp(Src) 97.8 F (36.6 C) (Oral)  Resp 18  SpO2 100% Physical Exam  Constitutional: She is oriented to person, place, and time. She appears well-developed and well-nourished.  Cardiovascular: Normal rate and regular rhythm.   Pulmonary/Chest: Effort normal and breath sounds normal.  Abdominal: Soft. Bowel sounds are normal.  rlq tenderness and guarding  Musculoskeletal: Normal range of motion.  Neurological: She is alert and oriented to person, place, and time. Coordination normal.  Skin: Skin is warm and dry.  Nursing note and vitals reviewed.   ED Course  Procedures (including critical care time) Labs Review Labs Reviewed  LIPASE, BLOOD - Abnormal; Notable for the following:    Lipase 162 (*)    All other components within normal limits  COMPREHENSIVE METABOLIC PANEL - Abnormal; Notable for the following:    Potassium 3.3 (*)    Glucose, Bld 133 (*)    Creatinine, Ser 1.07 (*)    GFR calc non Af Amer 51 (*)    GFR calc Af Amer 60 (*)    All other components within normal limits  URINALYSIS, ROUTINE W REFLEX MICROSCOPIC (NOT AT [facility]) - Abnormal; Notable for the following:    Hgb  urine dipstick LARGE (*)    All other components within normal limits  CBC  URINE MICROSCOPIC-ADD ON    Imaging Review Ct Abdomen Pelvis W Contrast  12/24/2014  CLINICAL DATA:  Right lower quadrant pain EXAM: CT ABDOMEN AND PELVIS WITH CONTRAST TECHNIQUE: Multidetector CT imaging of the abdomen and pelvis was performed using the standard protocol following bolus administration of intravenous contrast. CONTRAST:  OMNIPAQUE IOHEXOL 300 MG/ML  SOLN COMPARISON:  None. FINDINGS: Lower chest:  Lung bases are clear.  Heart size upper normal. Hepatobiliary: Normal liver. Normal gallbladder and bile ducts. Portal vein patent. Pancreas: Normal Spleen: Normal Adrenals/Urinary Tract: Mild  obstruction of the right kidney due to a stone measuring 3 x 6 mm. Stone is in the proximal ureter. No additional renal calculi. 4 cm left lower pole cyst. No renal mass. Urinary bladder normal. Stomach/Bowel: Negative for bowel obstruction. Diverticulosis of the sigmoid and left colon without evidence of diverticulitis. Normal appendix. Vascular/Lymphatic: Mild atherosclerotic disease in the aorta without aneurysm or obstruction. Reproductive: Lobulated mass involving the left fundus the uterus measures 4 x 6 cm with coarse calcifications. This is consistent with fibroid. Additional 15 mm fibroid on the right. Other: No free fluid Musculoskeletal: Grade 2 slip L5 on S1. Probable healed bilateral pars defects of L5. Fusion at L5-S1 vertebral bodies. IMPRESSION: 3 x 6 mm partially obstructing stone proximal right ureter. Sigmoid and left colon diverticulosis.  Negative for diverticulitis. Uterine fibroids. Grade 2 slip L5 on S1 appears chronic. Electronically Signed   By: Marlan Palau M.D.   On: 12/24/2014 15:13   I have personally reviewed and evaluated these images and lab results as part of my medical decision-making.   EKG Interpretation None      MDM   Final diagnoses:  Ureteral stone  Elevated lipase    Pt is comfortable at this time. Pt given return precautions. Will send home with oxycodone, zofran and flomax. Given urology referral. Think lipase is not related today, discussed having it rechecked. Slight elevation in cr noted    Teressa Lower, NP 12/24/14 1541  Nelva Nay, MD 12/25/14 2307

## 2014-12-24 NOTE — Discharge Instructions (Signed)
Kidney Stones  Kidney stones (urolithiasis) are solid masses that form inside your kidneys. The intense pain is caused by the stone moving through the kidney, ureter, bladder, and urethra (urinary tract). When the stone moves, the ureter starts to spasm around the stone. The stone is usually passed in your pee (urine).   HOME CARE  · Drink enough fluids to keep your pee clear or pale yellow. This helps to get the stone out.  · Take a 24-hour pee (urine) sample as told by your doctor. You may need to take another sample every 6-12 months.  · Strain all pee through the provided strainer. Do not pee without peeing through the strainer, not even once. If you pee the stone out, catch it in the strainer. The stone may be as small as a grain of salt. Take this to your doctor. This will help your doctor figure out what you can do to try to prevent more kidney stones.  · Only take medicine as told by your doctor.  · Make changes to your daily diet as told by your doctor. You may be told to:    Limit how much salt you eat.    Eat 5 or more servings of fruits and vegetables each day.    Limit how much meat, poultry, fish, and eggs you eat.  · Keep all follow-up visits as told by your doctor. This is important.  · Get follow-up X-rays as told by your doctor.  GET HELP IF:  You have pain that gets worse even if you have been taking pain medicine.  GET HELP RIGHT AWAY IF:   · Your pain does not get better with medicine.  · You have a fever or shaking chills.  · Your pain increases and gets worse over 18 hours.  · You have new belly (abdominal) pain.  · You feel faint or pass out.  · You are unable to pee.     This information is not intended to replace advice given to you by your health care provider. Make sure you discuss any questions you have with your health care provider.     Document Released: 07/20/2007 Document Revised: 10/22/2014 Document Reviewed: 07/04/2012  Elsevier Interactive Patient Education ©2016 [employer]vier  Inc[employer]

## 2014-12-30 ENCOUNTER — Encounter: Payer: Self-pay | Admitting: Family Medicine

## 2014-12-30 ENCOUNTER — Ambulatory Visit (INDEPENDENT_AMBULATORY_CARE_PROVIDER_SITE_OTHER): Payer: Medicare Other | Admitting: Family Medicine

## 2014-12-30 VITALS — BP 150/90 | HR 88 | Ht 61.0 in | Wt 165.0 lb

## 2014-12-30 DIAGNOSIS — R748 Abnormal levels of other serum enzymes: Secondary | ICD-10-CM

## 2014-12-30 DIAGNOSIS — Z23 Encounter for immunization: Secondary | ICD-10-CM

## 2014-12-30 DIAGNOSIS — N200 Calculus of kidney: Secondary | ICD-10-CM | POA: Diagnosis not present

## 2014-12-30 LAB — LIPASE: LIPASE: 27 U/L (ref 7–60)

## 2014-12-30 LAB — AMYLASE: AMYLASE: 38 U/L (ref 0–105)

## 2014-12-30 NOTE — Patient Instructions (Signed)
Try Flonase

## 2014-12-30 NOTE — Progress Notes (Signed)
   Subjective:    Patient ID: Susan MessingLana B Marse, female    DOB: 1946-02-01, 69 y.o.   MRN: [id number]  HPI She is here for a follow-up visit after recent trip to the emergency room on 11 9. She was having right mid and lower quadrant pain and thought she had the emergency room record including blood work and x-rays was reviewed. She did indeed have a right renal stone. Since leaving the hospital she has not had any more abdominal pain, nausea, vomiting, diarrhea, fever or chills. The ER record also showed a slightly elevated lipase. She does not drink.   Review of Systems     Objective:   Physical Exam Alert and in no distress. Abdominal exam shows no masses or tenderness.       Assessment & Plan:  Renal stone  Elevated lipase - Plan: Amylase, Lipase, CANCELED: Amylase, CANCELED: Lipase  Need for prophylactic vaccination against Streptococcus pneumoniae (pneumococcus) - Plan: Pneumococcal conjugate vaccine 13-valent  Need for prophylactic vaccination and inoculation against influenza - Plan: Flu vaccine HIGH DOSE PF (Fluzone High dose)  since she is now having no pain, think she probably passed the stone into her bladder. She is attempting to strain her urine. Recommended watchful waiting and if the symptoms recur, we will reevaluate. She does have pain medication at home. She is comfortable with this.

## 2015-02-08 ENCOUNTER — Emergency Department (HOSPITAL_COMMUNITY)
Admission: EM | Admit: 2015-02-08 | Discharge: 2015-02-08 | Disposition: A | Discharge status: Home / Self Care | Payer: Medicare Other | Attending: Emergency Medicine | Admitting: Emergency Medicine

## 2015-02-08 ENCOUNTER — Encounter (HOSPITAL_COMMUNITY): Payer: Self-pay | Admitting: Emergency Medicine

## 2015-02-08 DIAGNOSIS — Y998 Other external cause status: Secondary | ICD-10-CM | POA: Insufficient documentation

## 2015-02-08 DIAGNOSIS — S91311A Laceration without foreign body, right foot, initial encounter: Secondary | ICD-10-CM | POA: Diagnosis not present

## 2015-02-08 DIAGNOSIS — Z8709 Personal history of other diseases of the respiratory system: Secondary | ICD-10-CM | POA: Insufficient documentation

## 2015-02-08 DIAGNOSIS — I10 Essential (primary) hypertension: Secondary | ICD-10-CM | POA: Diagnosis not present

## 2015-02-08 DIAGNOSIS — W268XXA Contact with other sharp object(s), not elsewhere classified, initial encounter: Secondary | ICD-10-CM | POA: Diagnosis not present

## 2015-02-08 DIAGNOSIS — Y9389 Activity, other specified: Secondary | ICD-10-CM | POA: Diagnosis not present

## 2015-02-08 DIAGNOSIS — Y92009 Unspecified place in unspecified non-institutional (private) residence as the place of occurrence of the external cause: Secondary | ICD-10-CM | POA: Insufficient documentation

## 2015-02-08 HISTORY — DX: Essential (primary) hypertension: I10

## 2015-02-08 MED ORDER — LIDOCAINE HCL 1 % IJ SOLN
INTRAMUSCULAR | Status: AC
  Administered 2015-02-08: 5 mL
  Filled 2015-02-08: qty 20

## 2015-02-08 MED ORDER — LIDOCAINE HCL (PF) 1 % IJ SOLN: 5.0000 mL | Freq: Once | INTRAMUSCULAR | Status: DC

## 2015-02-08 NOTE — ED Notes (Signed)
Per pt, states she caught right heel on door and cut it

## 2015-02-08 NOTE — ED Provider Notes (Signed)
CSN: [id number]     Arrival date & time 02/08/15  1519 History  By signing my name below, I, Susan Dean, attest that this documentation has been prepared under the direction and in the presence of [employer], PA-C. Electronically Signed: Randell Dean, ED Scribe. 02/08/2015. 4:42 PM.     Chief Complaint  Dean presents with  . Foot Injury   The history is provided by the Dean. No language interpreter was used.   HPI Comments: GINNETTE Dean is a 69 y.o. female who presents to the Emergency Department after a right foot injury. Dean reports that she was exiting her house when her metal storm door caught the back of her right foot, lacerating her heel, followed immediately by pain and bleeding. She reports that her husband wrapped the site with a feminine pad and hersock to create a bandage and control bleeding. Dean denies hx of DM. Tetanus UTD.   Past Medical History  Diagnosis Date  . Mitral valve prolapse 12/56  . Allergic rhinitis, cause unspecified   . Allergy   . Hypertension    Past Surgical History  Procedure Laterality Date  . Cervical cone biopsy  76    [place]  . Hemorrhoid surgery  2007    rubber band ligation and infrared coagulation of internal hemorrhoids   Family History  Problem Relation Age of Onset  . Heart disease Mother   . Hypertension Mother   . Diabetes Mother   . Heart disease Father     CABG in mid-late 60's  . Hypertension Father   . Bipolar disorder Daughter   . Thyroid disease Daughter   . Cancer Maternal Aunt     breast cancer  . Hypertension Paternal Aunt    Social History  Substance Use Topics  . Smoking status: Never Smoker   . Smokeless tobacco: Never Used  . Alcohol Use: Yes     Comment: very seldom   OB History    No data available     Review of Systems A complete 10 system review of systems was obtained and all systems are negative except as noted in the HPI and PMH.    Allergies   Erythrocin  Home Medications   Prior to Admission medications   Medication Sig Start Date End Date Taking? Authorizing Provider  Multiple Vitamins-Minerals (EMERGEN-C IMMUNE) PACK Take 1 each by mouth daily as needed (congestion).    Yes Historical Provider, MD  oxyCODONE-acetaminophen (PERCOCET/ROXICET) 5-325 MG tablet Take 1-2 tablets by mouth every 6 (six) hours as needed for severe pain. Dean not taking: Reported on 12/30/2014 12/24/14   Teressa Lower, NP  tamsulosin (FLOMAX) 0.4 MG CAPS capsule Take 1 capsule (0.4 mg total) by mouth daily. Dean not taking: Reported on 02/08/2015 12/24/14   Teressa Lower, NP   BP 172/102 mmHg  Pulse 74  Temp(Src) 98.3 F (36.8 C) (Oral)  Resp 20  SpO2 99% Physical Exam  Constitutional: She is oriented to person, place, and time. She appears well-developed and well-nourished. No distress.  HENT:  Head: Normocephalic and atraumatic.  Eyes: Conjunctivae and EOM are normal.  Cardiovascular: Normal rate, regular rhythm and intact distal pulses.   Pulmonary/Chest: Effort normal. No stridor.  Musculoskeletal: Normal range of motion.       Legs: Neurological: She is alert and oriented to person, place, and time.  Skin:  5 cm full thickness laceration as diagrammed. No arterial bleeding. No gross contamination. Wound explored in depth in a bloodless field  in  good light with no bone or tendon exposed. FROM in ankle flexion and extension.  Psychiatric: She has a normal mood and affect.  Nursing note and vitals reviewed.   ED Course  .Marland Kitchen.Laceration Repair Date/Time: 02/08/2015 5:41 PM Performed by: Susan Dean, Susan Dean Authorized by: Susan Dean, Susan Dean Consent: Verbal consent obtained. Risks and benefits: risks, benefits and alternatives were discussed Consent given by: Dean Required items: required blood products, implants, devices, and special equipment available Dean identity confirmed: verbally with Dean Time out: Immediately  prior to procedure a "time out" was called to verify the correct Dean, procedure, equipment, support staff and site/side marked as required. Body area: lower extremity Location details: right lower leg Laceration length: 5 cm Foreign bodies: no foreign bodies Tendon involvement: none Nerve involvement: none Vascular damage: no Anesthesia: local infiltration Local anesthetic: lidocaine 1% without epinephrine Anesthetic total: 5 ml Dean sedated: no Preparation: Dean was prepped and draped in the usual sterile fashion. Irrigation solution: saline Irrigation method: syringe Amount of cleaning: extensive Debridement: moderate Skin closure: Ethilon (4-0) Number of sutures: 5 Technique: running Approximation: close Approximation difficulty: complex Dressing: antibiotic ointment and 4x4 sterile gauze Dean tolerance: Dean tolerated the procedure well with no immediate complications     DIAGNOSTIC STUDIES: Oxygen Saturation is 99% on RA, normal by my interpretation.    COORDINATION OF CARE: 4:07 PM Discussed prescribing antibiotics. Advised pt to return to ED if signs of wound infection present. Advised to not use hydrogen peroxide or rubbing alcohol on wound. Discussed wound care and advised Dean to return to ED or go to an urgent care for stitch removal in 10 days. Discussed treatment plan with pt at bedside and pt agreed to plan.  Labs Review Labs Reviewed - No data to display  Imaging Review No results found. I have personally reviewed and evaluated these images and lab results as part of my medical decision-making.   EKG Interpretation None      MDM   Final diagnoses:  Laceration of foot, right, initial encounter    Filed Vitals:   02/08/15 1533 02/08/15 1647  BP: 172/102 172/80  Pulse: 74 72  Temp: 98.3 F (36.8 C)   TempSrc: Oral   Resp: 20 16  SpO2: 99% 98%    Medications  lidocaine (PF) (XYLOCAINE) 1 % injection 5 mL (not administered)   lidocaine (XYLOCAINE) 1 % (with pres) injection (5 mLs  Given 02/08/15 1555)    Susan Dean is 69 y.o. female presenting with laceration to posterior right ankle just prior to arrival, tetanus is up-to-date. There does not appear to be any tendon damage she has excellent range of motion to the ankle, no vascular damage. Wound is cleaned in close and have instructed her on wound care and return precautions.  This is a shared visit with the attending physician who personally evaluated the Dean and agrees with the care plan.   Evaluation does not show pathology that would require ongoing emergent intervention or inpatient treatment. Pt is hemodynamically stable and mentating appropriately. Discussed findings and plan with Dean/guardian, who agrees with care plan. All questions answered. Return precautions discussed and outpatient follow up given.    I personally performed the services described in this documentation, which was scribed in my presence. The recorded information has been reviewed and is accurate.    Susan Emeryicole Marilyne Haseley, PA-C 02/08/15 1742  Arby BarretteMarcy Pfeiffer, MD 02/21/15 1003

## 2015-02-08 NOTE — ED Notes (Signed)
MD at bedside. 

## 2015-02-08 NOTE — ED Notes (Signed)
Pt was told by PCP in November that her tetanus was up to date.

## 2015-02-08 NOTE — Discharge Instructions (Signed)
Keep wound dry and do not remove dressing for 24 hours if possible. After that, wash gently morning and night (every 12 hours) with soap and water. Use a topical antibiotic ointment and cover with a bandaid or gauze.    Do NOT use rubbing alcohol or hydrogen peroxide, do not soak the area   Present to your primary care doctor or the urgent care of your choice, or the ED for suture removal in 10 days.   Every attempt was made to remove foreign body (contaminants) from the wound.  However, there is always a chance that some may remain in the wound. This can  increase your risk of infection.   If you see signs of infection (warmth, redness, tenderness, pus, sharp increase in pain, fever, red streaking in the skin) immediately return to the emergency department.   After the wound heals fully, apply sunscreen for 6-12 months to minimize scarring.    Laceration Care, Adult A laceration is a cut that goes through all of the layers of the skin and into the tissue that is right under the skin. Some lacerations heal on their own. Others need to be closed with stitches (sutures), staples, skin adhesive strips, or skin glue. Proper laceration care minimizes the risk of infection and helps the laceration to heal better. HOW TO CARE FOR YOUR LACERATION If sutures or staples were used:  Keep the wound clean and dry.  If you were given a bandage (dressing), you should change it at least one time per day or as told by your health care provider. You should also change it if it becomes wet or dirty.  Keep the wound completely dry for the first 24 hours or as told by your health care provider. After that time, you may shower or bathe. However, make sure that the wound is not soaked in water until after the sutures or staples have been removed.  Clean the wound one time each day or as told by your health care provider:  Wash the wound with soap and water.  Rinse the wound with water to remove all soap.  Pat  the wound dry with a clean towel. Do not rub the wound.  After cleaning the wound, apply a thin layer of antibiotic ointmentas told by your health care provider. This will help to prevent infection and keep the dressing from sticking to the wound.  Have the sutures or staples removed as told by your health care provider. If skin adhesive strips were used:  Keep the wound clean and dry.  If you were given a bandage (dressing), you should change it at least one time per day or as told by your health care provider. You should also change it if it becomes dirty or wet.  Do not get the skin adhesive strips wet. You may shower or bathe, but be careful to keep the wound dry.  If the wound gets wet, pat it dry with a clean towel. Do not rub the wound.  Skin adhesive strips fall off on their own. You may trim the strips as the wound heals. Do not remove skin adhesive strips that are still stuck to the wound. They will fall off in time. If skin glue was used:  Try to keep the wound dry, but you may briefly wet it in the shower or bath. Do not soak the wound in water, such as by swimming.  After you have showered or bathed, gently pat the wound dry with a clean  towel. Do not rub the wound.  Do not do any activities that will make you sweat heavily until the skin glue has fallen off on its own.  Do not apply liquid, cream, or ointment medicine to the wound while the skin glue is in place. Using those may loosen the film before the wound has healed.  If you were given a bandage (dressing), you should change it at least one time per day or as told by your health care provider. You should also change it if it becomes dirty or wet.  If a dressing is placed over the wound, be careful not to apply tape directly over the skin glue. Doing that may cause the glue to be pulled off before the wound has healed.  Do not pick at the glue. The skin glue usually remains in place for 5-10 days, then it falls off  of the skin. General Instructions  Take over-the-counter and prescription medicines only as told by your health care provider.  If you were prescribed an antibiotic medicine or ointment, take or apply it as told by your doctor. Do not stop using it even if your condition improves.  To help prevent scarring, make sure to cover your wound with sunscreen whenever you are outside after stitches are removed, after adhesive strips are removed, or when glue remains in place and the wound is healed. Make sure to wear a sunscreen of at least 30 SPF.  Do not scratch or pick at the wound.  Keep all follow-up visits as told by your health care provider. This is important.  Check your wound every day for signs of infection. Watch for:  Redness, swelling, or pain.  Fluid, blood, or pus.  Raise (elevate) the injured area above the level of your heart while you are sitting or lying down, if possible. SEEK MEDICAL CARE IF:  You received a tetanus shot and you have swelling, severe pain, redness, or bleeding at the injection site.  You have a fever.  A wound that was closed breaks open.  You notice a bad smell coming from your wound or your dressing.  You notice something coming out of the wound, such as wood or glass.  Your pain is not controlled with medicine.  You have increased redness, swelling, or pain at the site of your wound.  You have fluid, blood, or pus coming from your wound.  You notice a change in the color of your skin near your wound.  You need to change the dressing frequently due to fluid, blood, or pus draining from the wound.  You develop a new rash.  You develop numbness around the wound. SEEK IMMEDIATE MEDICAL CARE IF:  You develop severe swelling around the wound.  Your pain suddenly increases and is severe.  You develop painful lumps near the wound or on skin that is anywhere on your body.  You have a red streak going away from your wound.  The wound is  on your hand or foot and you cannot properly move a finger or toe.  The wound is on your hand or foot and you notice that your fingers or toes look pale or bluish.   This information is not intended to replace advice given to you by your health care provider. Make sure you discuss any questions you have with your health care provider.   Document Released: 01/31/2005 Document Revised: 06/17/2014 Document Reviewed: 01/27/2014 Elsevier Interactive Patient Education [employer]

## 2015-02-18 ENCOUNTER — Ambulatory Visit (INDEPENDENT_AMBULATORY_CARE_PROVIDER_SITE_OTHER): Payer: Medicare Other | Admitting: Family Medicine

## 2015-02-18 ENCOUNTER — Encounter: Payer: Self-pay | Admitting: Family Medicine

## 2015-02-18 VITALS — BP 130/90 | Wt 164.0 lb

## 2015-02-18 DIAGNOSIS — S91311A Laceration without foreign body, right foot, initial encounter: Secondary | ICD-10-CM

## 2015-02-18 NOTE — Progress Notes (Signed)
   Subjective:    Patient ID: Susan MessingLana B Welliver, female    DOB: Dec 20, 1945, 70 y.o.   MRN: [id number]  HPI He sustained a laceration to her right heel approximately 10 days ago. She did have sutures applied.   Review of Systems     Objective:   Physical Exam Exam of the right heel does show a healing laceration with some erythema from the stitches.       Assessment & Plan:  Laceration of heel, right, initial encounter The sutures were removed without difficulty and steri strips applied

## 2015-05-19 ENCOUNTER — Telehealth: Payer: Self-pay | Admitting: Family Medicine

## 2015-05-19 NOTE — Telephone Encounter (Signed)
cpe needed 2/1/-08/14/15 with [employer] [place] form

## 2015-11-06 ENCOUNTER — Telehealth: Payer: Self-pay | Admitting: Family Medicine

## 2015-11-06 NOTE — Telephone Encounter (Signed)
Left message for pt to call. She needs a wellness exam.

## 2015-12-11 ENCOUNTER — Other Ambulatory Visit (INDEPENDENT_AMBULATORY_CARE_PROVIDER_SITE_OTHER): Payer: Medicare Other

## 2015-12-11 DIAGNOSIS — Z23 Encounter for immunization: Secondary | ICD-10-CM | POA: Diagnosis not present

## 2016-01-12 NOTE — Progress Notes (Signed)
Chief Complaint  Patient presents with  . Medicare Wellness    nonfasting AWV (blood is in lab) with pap. Has some cystic knotty areas around her body she would like you to look at. Has some dry spots (moles) on her back she would like some suggestions on.     Susan Dean is a 70 y.o. female who presents for annual physical exam, and Medicare wellness visit. I last saw her for a physical in 05/2011.  Sometimes her knees feel a little tight when she bends them.  May occur if she has been standing for a while and needs to bend, or sitting for a while.  No pain with weight-bearing activity, no visible swelling, no giving way.  She has been seen twice in the last year, as ER follow-ups.  Once was a year ago with a 3 x 6 mm partially obstructing stone proximal right ureter.  Shortly after, she had a heel laceration. No further problems related to these issues.  Chronic sinus drainage and congestion, mostly just in the mornings. She hasn't needed to take Coricidin in about a year. She has been taking Emergen-C and symptoms have improved to the point where she doesn't need coricidin.  Urinary leakage--fairy chronic over the last few years.  Denies dysuria. Leakage both with coughing/sneezing, and if bladder is full. She wears a thin pad, and is tolerable. Denies dysuria, hematuria, abdominal pain.  She has a h/o tingling in her right lateral thigh--she feels like this has improved, is less often.  She denies significant back pain, but "has to be careful with back"  H/o elevated blood pressures at the doctor.  BP at home last week was 134/?Marland Kitchen  Checks it infrequently, sometimes sees up to 140.  Immunization History  Administered Date(s) Administered  . Influenza Split 11/18/2008, 01/05/2010, 12/02/2010, 12/16/2011  . Influenza, High Dose Seasonal PF 12/04/2012, 12/13/2013, 12/30/2014, 12/11/2015  . Pneumococcal Conjugate-13 12/30/2014  . Pneumococcal Polysaccharide-23 05/16/2011  . Td 05/15/2005   . Tdap 05/16/2011  . Zoster 01/22/2009   Last Pap smear: 05/2011 normal (HPV not tested) Last mammogram: 8/07 Last colonoscopy:12/25/05--hyperplastic polyp and internal hemorrhoids (treated with rubber band ligation and infrared coagulation).   Last DEXA: she believes she had one approx 2004 or 2007, reportedly normal (no records in chart), although she thinks there may have been some thinning.  This was ordered through her GYN (so results are likely there).  She no longer sees them Dentist:  Hasn't gone in the last couple of years Ophtho: every 2 years Exercise:  walks in place while she dries her hair (550 steps); rakes the yard. Vitamin D was low at 28 in 05/2011   Other doctors caring for patient include: Ophtho: [facility] [place] GI: at [facility] (Dr. Harrell Lark)  Depression screen:  Negative Fall screen: 2 falls--tripped over husband's oxygen tubing once, and fell once playing with the grandkids--no injury Functional status screen--see epic questionnaire. Notable for difficulty with hearing (not as good as it used to be, has tinnitus), vision (not as good as in the past, has early cataracts), some trouble remembering words she hasn't used in a long time, some names (only slightly worse), stairs (only if 3-5 flights, otherwise is fine).   End of Life Discussion:  Patient does not have a living will and medical power of attorney  Past Medical History:  Diagnosis Date  . Allergic rhinitis, cause unspecified   . Allergy   . Hypertension   . Mitral valve prolapse  12/56    Past Surgical History:  Procedure Laterality Date  . CERVICAL CONE BIOPSY  76   [place]  . HEMORRHOID SURGERY  2007   rubber band ligation and infrared coagulation of internal hemorrhoids    Social History   Social History  . Marital status: Married    Spouse name: N/A  . Number of children: 3  . Years of education: N/A   Occupational History  . 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 Smoking status: Never Smoker  . Smokeless tobacco: Never Used  . Alcohol use Yes     Comment: very seldom  . Drug use: No  . Sexual activity: Yes    Partners: Male   Other Topics Concern  . Not on file   Social History Narrative   Lives with husband.  2 children live in [place], 1 in [place]. 3 grandchildren.   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 Heart disease Mother   . Hypertension Mother   . Diabetes Mother   . Heart disease Father     CABG in mid-late 60's  . Hypertension Father   . Bipolar disorder Daughter   . Thyroid disease Daughter   . Heart murmur Son   . Retinal detachment Son   . Cancer Maternal Aunt     breast cancer  . Hypertension Paternal Aunt     Outpatient Encounter Prescriptions as of 01/13/2016  Medication Sig  . Multiple Vitamins-Minerals (EMERGEN-C IMMUNE) PACK Take 1 each by mouth daily as needed (congestion).    No facility-administered encounter medications on file as of 01/13/2016.     Allergies  Allergen Reactions  . Erythrocin Other (See Comments)    Swelling and puffiness.    ROS:  The patient denies anorexia, fever, weight changes, headaches,  vision changes, decreased hearing, ear pain, sore throat, breast concerns, chest pain, palpitations, dizziness, syncope, dyspnea on exertion, cough, swelling, nausea, vomiting, diarrhea, abdominal pain, melena, hematochezia, indigestion/heartburn, hematuria, dysuria, vaginal bleeding, discharge, odor or itch, genital lesions, joint pains, numbness, tingling, weakness, tremor, suspicious skin lesions, depression, anxiety, abnormal bleeding/bruising, or enlarged lymph nodes. +stress urinary incontinence Knee stiffness per HPI.  Some mild tinnitus and slight hearing loss Rare tingling right lateral thigh (less often than in the past) Chronic head congestion, occasional cough (resolved). Intermittent  constipation Worries about her husband's health (has COPD); denies insomnia.  PHYSICAL EXAM:  BP (!) 160/80 (BP Location: Left Arm, Patient Position: Sitting, Cuff Size: Normal)   Pulse 80   Ht 5\' 2"  (1.575 m)   Wt 157 lb (71.2 kg)   BMI 28.72 kg/m   172/70 on repeat by MD   General Appearance:    Alert, cooperative, no distress, appears stated age, talkative  Head:    Normocephalic, without obvious abnormality, atraumatic  Eyes:    PERRL, conjunctiva/corneas clear, EOM's intact, fundi benign  Ears:    Normal TM's and external ear canals  Nose:   Nares normal, mucosa mildly edematous, no erythema, drainage or sinus tenderness  Throat:   Lips, mucosa, and tongue normal; teeth and gums normal  Neck:   Supple, no lymphadenopathy;  thyroid:  no   enlargement/tenderness/nodules; no carotid bruit or JVD  Back:    Spine nontender, no curvature, ROM normal, no CVA     tenderness  Lungs:     Clear to auscultation bilaterally without wheezes, rales  or ronchi; respirations unlabored  Chest Wall:    No tenderness or deformity   Heart:    Regular rate and rhythm, S1 and S2 normal, no murmur, rub or gallop  Breast Exam:    No tenderness, masses, or nipple discharge or inversion.  No axillary lymphadenopathy  Abdomen:     Soft, non-tender, nondistended, normoactive bowel sounds, no masses, no hepatosplenomegaly  Genitalia:    Normal external genitalia without lesions.  BUS and vagina normal; cervix without lesions, or cervical motion tenderness. No abnormal vaginal discharge.  Uterus and adnexa not enlarged, nontender, no masses.  Pap performed  Rectal:    Normal tone, no masses or tenderness; guaiac negative stool  Extremities:   No clubbing, cyanosis or edema. Ulnar deviation of distal phalanx of both 4th fingers, radial deviation of left 3rd distal phalanx  Pulses:   2+ and symmetric all extremities  Skin:   Skin color, texture, turgor normal, no rashes or lesions. Many seborrheic keratoses,  noninflamed, and angiomas throughout. One small skin tag (not inflamed) in central low back Mild erythema below breasts bilaterally  Lymph nodes:   Cervical, supraclavicular, and axillary nodes normal  Neurologic:   CNII-XII intact, normal strength, sensation and gait; reflexes 2+ and symmetric throughout                             Psych:   Normal mood, affect, hygiene and grooming   ASSESSMENT/PLAN:  Annual physical exam - Plan: Lipid panel, Comprehensive metabolic panel, CBC with Differential/Platelet, VITAMIN D 25 Hydroxy (Vit-D Deficiency, Fractures), TSH, Vitamin B12, Hepatitis C antibody, Cytology - PAP [place]  Medicare annual wellness visit, initial  Need for hepatitis C screening test - Plan: Hepatitis C antibody  Low vitamin B12 level - borderline low in past with paresthesias. not currently taking any supplement/vitamins. Recheck - Plan: Vitamin B12  Vitamin D deficiency - discussed importance of calcium, D, weight-bearing exercise - Plan: VITAMIN D 25 Hydroxy (Vit-D Deficiency, Fractures)  Elevated blood pressure reading without diagnosis of hypertension - low sodium diet, regular exercise, monitor at home; NV and have monitor checked within the month; OV if remains elevated   CBC, c-met, TSH, Vit D, lipid, B12 (borderline low in past at 247)  Hep C  Vit D deficiency--borderline low at 28 in past, still not taking any supplements. Recheck today  Info/forms given re: healthcare POA and Living Will. Discussed the importance and encouraged her to complete and get [place] copies Full Code, Full care.   Discussed monthly self breast exams and yearly mammograms (she is past due); at least 30 minutes of aerobic activity at least 5 days/week, weight-bearing exercise at least 2x/week; proper sunscreen use reviewed; healthy diet, including goals of calcium and vitamin D intake and alcohol recommendations (less than or equal to 1 drink/day) reviewed; regular seatbelt use; changing  batteries in smoke detectors.  Immunization recommendations discussed--UTD; consider Shingrix when available.  Colonoscopy recommendations reviewed--due now.  Discuss cologard vs colonoscopy  Please check to see if [facility] your mammogram results from 10 years ago--if so, go there for your mammogram and also schedule a bone density.  If they have no record, then it is your choice as to whether you go to [facility], or to the [facility].  Let us know, and we can [facility] the [facility] density [facility].  Please get a routine dental exam and cleaning.  You are due for colon cancer screening.  Options are to contact [place] clinic and schedule visit. We also discussed the other option of Cologard testing.  Let us know if you would like that instead (fecal DNA test, to be done every 3 years if normal, and you will need a colonoscopy if it is abnormal).  Please get at least  150 minutes of aerobic exercise weeky, and weight bearing exercise at least 2x per week. We will contact you with your lab results to let you know the final decision about Vitamin D supplement.  If your level is normal (or close to normal), then taking 1000 IU daily is recommended (long-term)  Please limit the sodium in your diet (see handout). Monitor your blood pressure more regularly--at least 3 times/week.  Please set up a nurse visit (or an office visit) in 3-4 weeks.  Bring your list of blood pressure, and bring your home monitor.  If your monitor is accurate and blood pressure are okay, no need to see the doctor.  If the monitor isn't accurate or if BP at home is consistently >135-140/85-90, then you need to see me to discuss medication. Regular exercise will help keep the blood pressure down.    Medicare Attestation I have personally reviewed: The patient's medical and social history Their use of alcohol, tobacco or illicit drugs Their current medications and supplements The patient's  functional ability including ADLs,fall risks, home safety risks, cognitive, and hearing and visual impairment Diet and physical activities Evidence for depression or mood disorders  The patient's weight, height, BMI, and visual acuity have been recorded in the chart.  I have made referrals, counseling, and provided education to the patient based on review of the above and I have provided the patient with a written personalized care plan for preventive services.     Enio Hornback A, MD   01/12/2016

## 2016-01-13 ENCOUNTER — Encounter: Payer: Self-pay | Admitting: Family Medicine

## 2016-01-13 ENCOUNTER — Other Ambulatory Visit (HOSPITAL_COMMUNITY)
Admission: RE | Admit: 2016-01-13 | Discharge: 2016-01-13 | Disposition: A | Discharge status: Home / Self Care | Payer: Medicare Other | Source: Ambulatory Visit | Attending: Family Medicine | Admitting: Family Medicine

## 2016-01-13 ENCOUNTER — Ambulatory Visit (INDEPENDENT_AMBULATORY_CARE_PROVIDER_SITE_OTHER): Payer: Medicare Other | Admitting: Family Medicine

## 2016-01-13 VITALS — BP 160/80 | HR 80 | Ht 62.0 in | Wt 157.0 lb

## 2016-01-13 DIAGNOSIS — Z0000 Encounter for general adult medical examination without abnormal findings: Secondary | ICD-10-CM | POA: Insufficient documentation

## 2016-01-13 DIAGNOSIS — R030 Elevated blood-pressure reading, without diagnosis of hypertension: Secondary | ICD-10-CM

## 2016-01-13 DIAGNOSIS — Z780 Asymptomatic menopausal state: Secondary | ICD-10-CM | POA: Insufficient documentation

## 2016-01-13 DIAGNOSIS — Z1159 Encounter for screening for other viral diseases: Secondary | ICD-10-CM | POA: Diagnosis not present

## 2016-01-13 DIAGNOSIS — E538 Deficiency of other specified B group vitamins: Secondary | ICD-10-CM

## 2016-01-13 DIAGNOSIS — E559 Vitamin D deficiency, unspecified: Secondary | ICD-10-CM | POA: Diagnosis not present

## 2016-01-13 DIAGNOSIS — Z79899 Other long term (current) drug therapy: Secondary | ICD-10-CM | POA: Diagnosis not present

## 2016-01-13 LAB — COMPREHENSIVE METABOLIC PANEL
ALBUMIN: 3.9 g/dL (ref 3.6–5.1)
ALK PHOS: 58 U/L (ref 33–130)
ALT: 13 U/L (ref 6–29)
AST: 16 U/L (ref 10–35)
BUN: 16 mg/dL (ref 7–25)
CHLORIDE: 107 mmol/L (ref 98–110)
CO2: 27 mmol/L (ref 20–31)
Calcium: 9.3 mg/dL (ref 8.6–10.4)
Creat: 0.96 mg/dL — ABNORMAL HIGH (ref 0.60–0.93)
Glucose, Bld: 104 mg/dL — ABNORMAL HIGH (ref 65–99)
POTASSIUM: 4 mmol/L (ref 3.5–5.3)
Sodium: 141 mmol/L (ref 135–146)
TOTAL PROTEIN: 6.3 g/dL (ref 6.1–8.1)
Total Bilirubin: 0.5 mg/dL (ref 0.2–1.2)

## 2016-01-13 LAB — CBC WITH DIFFERENTIAL/PLATELET
BASOS PCT: 0 %
Basophils Absolute: 0 cells/uL (ref 0–200)
EOS PCT: 2 %
Eosinophils Absolute: 122 cells/uL (ref 15–500)
HEMATOCRIT: 37.2 % (ref 35.0–45.0)
Hemoglobin: 12.1 g/dL (ref 11.7–15.5)
LYMPHS ABS: 1769 {cells}/uL (ref 850–3900)
LYMPHS PCT: 29 %
MCH: 31.7 pg (ref 27.0–33.0)
MCHC: 32.5 g/dL (ref 32.0–36.0)
MCV: 97.4 fL (ref 80.0–100.0)
MONO ABS: 671 {cells}/uL (ref 200–950)
MPV: 9.9 fL (ref 7.5–12.5)
Monocytes Relative: 11 %
Neutro Abs: 3538 cells/uL (ref 1500–7800)
Neutrophils Relative %: 58 %
Platelets: 279 10*3/uL (ref 140–400)
RBC: 3.82 MIL/uL (ref 3.80–5.10)
RDW: 13.5 % (ref 11.0–15.0)
WBC: 6.1 10*3/uL (ref 4.0–10.5)

## 2016-01-13 LAB — LIPID PANEL
CHOLESTEROL: 167 mg/dL (ref <200)
HDL: 63 mg/dL (ref >50)
LDL Cholesterol: 86 mg/dL (ref <100)
TRIGLYCERIDES: 88 mg/dL (ref <150)
Total CHOL/HDL Ratio: 2.7 Ratio (ref <5.0)
VLDL: 18 mg/dL (ref <30)

## 2016-01-13 LAB — TSH: TSH: 2.32 m[IU]/L

## 2016-01-13 LAB — VITAMIN B12: VITAMIN B 12: 239 pg/mL (ref 200–1100)

## 2016-01-13 NOTE — Patient Instructions (Addendum)
HEALTH MAINTENANCE RECOMMENDATIONS:  It is recommended that you get at least 30 minutes of aerobic exercise at least 5 days/week (for weight loss, you may need as much as 60-90 minutes). This can be any activity that gets your heart rate up. This can be divided in 10-15 minute intervals if needed, but try and build up your endurance at least once a week.  Weight bearing exercise is also recommended twice weekly.  Eat a healthy diet with lots of vegetables, fruits and fiber.  "Colorful" foods have a lot of vitamins (ie green vegetables, tomatoes, red peppers, etc).  Limit sweet tea, regular sodas and alcoholic beverages, all of which has a lot of calories and sugar.  Up to 1 alcoholic drink daily may be beneficial for women (unless trying to lose weight, watch sugars).  Drink a lot of water.  Calcium recommendations are 1200-1500 mg daily (1500 mg for postmenopausal women or women without ovaries), and vitamin D 1000 IU daily.  This should be obtained from diet and/or supplements (vitamins), and calcium should not be taken all at once, but in divided doses.  Monthly self breast exams and yearly mammograms for women over the age of 71 is recommended.  Sunscreen of at least SPF 30 should be used on all sun-exposed parts of the skin when outside between the hours of 10 am and 4 pm (not just when at beach or pool, but even with exercise, golf, tennis, and yard work!)  Use a sunscreen that says "broad spectrum" so it covers both UVA and UVB rays, and make sure to reapply every 1-2 hours.  Remember to change the batteries in your smoke detectors when changing your clock times in the spring and fall.  Use your seat belt every time you are in a car, and please drive safely and not be distracted with cell phones and texting while driving.    Ms. Royce Macadamiainker , Thank you for taking time to come for your Medicare Wellness Visit. I appreciate your ongoing commitment to your health goals. Please review the  following plan we discussed and let me know if I can assist you in the future.   These are the goals we discussed: Goals    None      This is a list of the screening recommended for you and due dates:  Health Maintenance  Topic Date Due  .  Hepatitis C: One time screening is recommended by Center for Disease Control  (CDC) for  adults born from [birth year] through 1966.   Mar 16, 1945  . Mammogram  10/07/2007  . DEXA scan (bone density measurement)  05/27/2009  . Colon Cancer Screening  12/26/2015  . Tetanus Vaccine  05/15/2021  . Flu Shot  Completed  . Shingles Vaccine  Completed  . Pneumonia vaccines  Completed   We will be checking your hepatitis C screen with your labs from today. Consider Shingrix (the new shingles vaccine) when it is available.   Please get a routine dental exam and cleaning.  You are due for colon cancer screening.  Options are to contact [facility] and schedule visit. We also discussed the other option of Cologard testing.  Let us know if you would like that instead (fecal DNA test, to be done every 3 years if normal, and you will need a colonoscopy if it is abnormal).  Please get at least  150 minutes of aerobic exercise weeky, and weight bearing exercise at least 2x per week. We will contact you with your  lab results to let you know the final decision about Vitamin D supplement.  If your level is normal (or close to normal), then taking 1000 IU daily is recommended (long-term)   Please limit the sodium in your diet (see handout). Monitor your blood pressure more regularly--at least 3 times/week.  Please set up a nurse visit (or an office visit) in 3-4 weeks.  Bring your list of blood pressure, and bring your home monitor.  If your monitor is accurate and blood pressure are okay, no need to see the doctor.  If the monitor isn't accurate or if BP at home is consistently >135-140/85-90, then you need to see me to discuss medication. Regular exercise will help keep  the blood pressure down.    Heart-Healthy Eating Plan Introduction Heart-healthy meal planning includes:  Limiting unhealthy fats.  Increasing healthy fats.  Making other small dietary changes. You may need to talk with your doctor or a diet specialist (dietitian) to create an eating plan that is right for you. What types of fat should I choose?  Choose healthy fats. These include olive oil and canola oil, flaxseeds, walnuts, almonds, and seeds.  Eat more omega-3 fats. These include salmon, mackerel, sardines, tuna, flaxseed oil, and ground flaxseeds. Try to eat fish at least twice each week.  Limit saturated fats.  Saturated fats are often found in animal products, such as meats, butter, and cream.  Plant sources of saturated fats include palm oil, palm kernel oil, and coconut oil.  Avoid foods with partially hydrogenated oils in them. These include stick margarine, some tub margarines, cookies, crackers, and other baked goods. These contain trans fats. What general guidelines do I need to follow?  Check food labels carefully. Identify foods with trans fats or high amounts of saturated fat.  Fill one half of your plate with vegetables and green salads. Eat 4-5 servings of vegetables per day. A serving of vegetables is:  1 cup of raw leafy vegetables.   cup of raw or cooked cut-up vegetables.   cup of vegetable juice.  Fill one fourth of your plate with whole grains. Look for the word "whole" as the first word in the ingredient list.  Fill one fourth of your plate with lean protein foods.  Eat 4-5 servings of fruit per day. A serving of fruit is:  One medium whole fruit.   cup of dried fruit.   cup of fresh, frozen, or canned fruit.   cup of 100% fruit juice.  Eat more foods that contain soluble fiber. These include apples, broccoli, carrots, beans, peas, and barley. Try to get 20-30 g of fiber per day.  Eat more home-cooked food. Eat less restaurant,  buffet, and fast food.  Limit or avoid alcohol.  Limit foods high in starch and sugar.  Avoid fried foods.  Avoid frying your food. Try baking, boiling, grilling, or broiling it instead. You can also reduce fat by:  Removing the skin from poultry.  Removing all visible fats from meats.  Skimming the fat off of stews, soups, and gravies before serving them.  Steaming vegetables in water or broth.  Lose weight if you are overweight.  Eat 4-5 servings of nuts, legumes, and seeds per week:  One serving of dried beans or legumes equals  cup after being cooked.  One serving of nuts equals 1 ounces.  One serving of seeds equals  ounce or one tablespoon.  You may need to keep track of how much salt or sodium you  eat. This is especially true if you have high blood pressure. Talk with your doctor or dietitian to get more information. What foods can I eat? Grains  Breads, including [place], white, pita, wheat, raisin, rye, oatmeal, and [place]. Tortillas that are neither fried nor made with lard or trans fat. Low-fat rolls, including hotdog and hamburger buns and English muffins. Biscuits. Muffins. Waffles. Pancakes. Light popcorn. Whole-grain cereals. Flatbread. Melba toast. Pretzels. Breadsticks. Rusks. Low-fat snacks. Low-fat crackers, including oyster, saltine, matzo, graham, animal, and rye. Rice and pasta, including brown rice and pastas that are made with whole wheat. Vegetables  All vegetables. Fruits  All fruits, but limit coconut. Meats and Other Protein Sources  Lean, well-trimmed beef, veal, pork, and lamb. Chicken and [place] without skin. All fish and shellfish. Wild duck, rabbit, pheasant, and venison. Egg whites or low-cholesterol egg substitutes. Dried beans, peas, lentils, and tofu. Seeds and most nuts. Dairy  Low-fat or nonfat cheeses, including ricotta, string, and mozzarella. Skim or 1% milk that is liquid, powdered, or evaporated. Buttermilk that is made with  low-fat milk. Nonfat or low-fat yogurt. Beverages  Mineral water. Diet carbonated beverages. Sweets and Desserts  Sherbets and fruit ices. Honey, jam, marmalade, jelly, and syrups. Meringues and gelatins. Pure sugar candy, such as hard candy, jelly beans, gumdrops, mints, marshmallows, and small amounts of dark chocolate. MGM M[employer]el food cake. Eat all sweets and desserts in moderation. Fats and Oils  Nonhydrogenated (trans-free) margarines. Vegetable oils, including soybean, sesame, sunflower, olive, peanut, safflower, corn, canola, and cottonseed. Salad dressings or mayonnaise made with a vegetable oil. Limit added fats and oils that you use for cooking, baking, salads, and as spreads. Other  Cocoa powder. Coffee and tea. All seasonings and condiments. The items listed above may not be a complete list of recommended foods or beverages. Contact your dietitian for more options.  What foods are not recommended? Grains  Breads that are made with saturated or trans fats, oils, or whole milk. Croissants. Butter rolls. Cheese breads. Sweet rolls. Donuts. Buttered popcorn. Chow mein noodles. High-fat crackers, such as cheese or butter crackers. Meats and Other Protein Sources  Fatty meats, such as hotdogs, short ribs, sausage, spareribs, bacon, rib eye roast or steak, and mutton. High-fat deli meats, such as salami and bologna. Caviar. Domestic duck and goose. Organ meats, such as kidney, liver, sweetbreads, and heart. Dairy  Cream, sour cream, cream cheese, and creamed cottage cheese. Whole-milk cheeses, including blue (bleu), [address], [place], [place], [address], [place], [address], cheddar, [place]embert, and [place]. Whole or 2% milk that is liquid, evaporated, or condensed. Whole buttermilk. Cream sauce or high-fat cheese sauce. Yogurt that is made from whole milk. Beverages  Regular sodas and juice drinks with added sugar. Sweets and Desserts  Frosting. Pudding. Cookies. Cakes other than angel food cake.  Candy that has milk chocolate or white chocolate, hydrogenated fat, butter, coconut, or unknown ingredients. Buttered syrups. Full-fat ice cream or ice cream drinks. Fats and Oils  Gravy that has suet, meat fat, or shortening. Cocoa butter, hydrogenated oils, palm oil, coconut oil, palm kernel oil. These can often be found in baked products, candy, fried foods, nondairy creamers, and whipped toppings. Solid fats and shortenings, including bacon fat, salt pork, lard, and butter. Nondairy cream substitutes, such as coffee creamers and sour cream substitutes. Salad dressings that are made of unknown oils, cheese, or sour cream. The items listed above may not be a complete list of foods and beverages to avoid. Contact your dietitian for more information.  This information is not intended to replace advice given to you by your health care provider. Make sure you discuss any questions you have with your health care provider. Document Released: 08/02/2011 Document Revised: 07/09/2015 Document Reviewed: 07/25/2013  2017 Elsevier

## 2016-01-14 ENCOUNTER — Encounter: Payer: Self-pay | Admitting: Family Medicine

## 2016-01-14 ENCOUNTER — Other Ambulatory Visit: Payer: Self-pay | Admitting: *Deleted

## 2016-01-14 DIAGNOSIS — E538 Deficiency of other specified B group vitamins: Secondary | ICD-10-CM | POA: Insufficient documentation

## 2016-01-14 DIAGNOSIS — E559 Vitamin D deficiency, unspecified: Secondary | ICD-10-CM | POA: Insufficient documentation

## 2016-01-14 DIAGNOSIS — R7301 Impaired fasting glucose: Secondary | ICD-10-CM | POA: Insufficient documentation

## 2016-01-14 LAB — HEPATITIS C ANTIBODY: HCV Ab: NEGATIVE

## 2016-01-14 LAB — VITAMIN D 25 HYDROXY (VIT D DEFICIENCY, FRACTURES): VIT D 25 HYDROXY: 26 ng/mL — AB (ref 30–100)

## 2016-01-14 MED ORDER — ERGOCALCIFEROL 1.25 MG (50000 UT) PO CAPS: 50000.0000 [IU] | ORAL_CAPSULE | ORAL | 0 refills | Status: AC

## 2016-01-18 ENCOUNTER — Encounter: Payer: Self-pay | Admitting: Family Medicine

## 2016-01-18 LAB — CYTOLOGY - PAP
Diagnosis: NEGATIVE
HPV (WINDOPATH): NOT DETECTED

## 2016-07-12 ENCOUNTER — Ambulatory Visit (INDEPENDENT_AMBULATORY_CARE_PROVIDER_SITE_OTHER): Payer: Medicare Other | Admitting: Family Medicine

## 2016-07-12 DIAGNOSIS — R509 Fever, unspecified: Secondary | ICD-10-CM

## 2016-07-12 DIAGNOSIS — R319 Hematuria, unspecified: Secondary | ICD-10-CM | POA: Diagnosis not present

## 2016-07-12 DIAGNOSIS — J0100 Acute maxillary sinusitis, unspecified: Secondary | ICD-10-CM | POA: Diagnosis not present

## 2016-07-12 LAB — COMPREHENSIVE METABOLIC PANEL
ALK PHOS: 55 U/L (ref 33–130)
ALT: 12 U/L (ref 6–29)
AST: 17 U/L (ref 10–35)
Albumin: 3.6 g/dL (ref 3.6–5.1)
BUN: 15 mg/dL (ref 7–25)
CALCIUM: 8.8 mg/dL (ref 8.6–10.4)
CO2: 23 mmol/L (ref 20–31)
Chloride: 97 mmol/L — ABNORMAL LOW (ref 98–110)
Creat: 1.12 mg/dL — ABNORMAL HIGH (ref 0.60–0.93)
GLUCOSE: 129 mg/dL — AB (ref 65–99)
POTASSIUM: 3.7 mmol/L (ref 3.5–5.3)
Sodium: 135 mmol/L (ref 135–146)
Total Bilirubin: 0.6 mg/dL (ref 0.2–1.2)
Total Protein: 6.3 g/dL (ref 6.1–8.1)

## 2016-07-12 LAB — CBC WITH DIFFERENTIAL/PLATELET
BASOS PCT: 0 %
Basophils Absolute: 0 cells/uL (ref 0–200)
EOS ABS: 0 {cells}/uL — AB (ref 15–500)
Eosinophils Relative: 0 %
HEMATOCRIT: 39.7 % (ref 35.0–45.0)
Hemoglobin: 13.2 g/dL (ref 11.7–15.5)
LYMPHS PCT: 7 %
Lymphs Abs: 805 cells/uL — ABNORMAL LOW (ref 850–3900)
MCH: 31.4 pg (ref 27.0–33.0)
MCHC: 33.2 g/dL (ref 32.0–36.0)
MCV: 94.5 fL (ref 80.0–100.0)
MONO ABS: 575 {cells}/uL (ref 200–950)
MPV: 9.9 fL (ref 7.5–12.5)
Monocytes Relative: 5 %
NEUTROS PCT: 88 %
Neutro Abs: 10120 cells/uL — ABNORMAL HIGH (ref 1500–7800)
Platelets: 211 10*3/uL (ref 140–400)
RBC: 4.2 MIL/uL (ref 3.80–5.10)
RDW: 13.2 % (ref 11.0–15.0)
WBC: 11.5 10*3/uL — ABNORMAL HIGH (ref 4.0–10.5)

## 2016-07-12 LAB — POCT URINALYSIS DIPSTICK
Bilirubin, UA: 1
GLUCOSE UA: NEGATIVE
Ketones, UA: 1.5
NITRITE UA: NEGATIVE
PH UA: 6 (ref 5.0–8.0)
Protein, UA: 1
RBC UA: POSITIVE
SPEC GRAV UA: 1.02 (ref 1.010–1.025)
Urobilinogen, UA: 1 E.U./dL

## 2016-07-12 MED ORDER — AMOXICILLIN-POT CLAVULANATE 875-125 MG PO TABS: 1.0000 | ORAL_TABLET | Freq: Two times a day (BID) | ORAL | 0 refills | Status: DC

## 2016-07-12 NOTE — Progress Notes (Signed)
Subjective:     Patient ID: Modesta MessingLana B Antu, female   DOB: 1945/04/12, 71 y.o.   MRN: [id number]  HPI Abigail ButtsLana Stradford is a 71 year old female who presents with a 5 day history of a metallic taste in her mouth. She developed headache, nasal congestion, aching, and fatigue 4 days ago as well as a small cough with white mucus. She also developed a right sided sharp abdominal pain at that time as well. She hasn't had anything to eat for the last 3 days because she hasn't had any appetite. She has been drinking fluids normally. She notes she is dizzy when standing today. She has had fevers and chills with a fever of 102.3 noted at home yesterday. She's had no nausea, vomiting, dysuria, change in BMs, SOB, sick contacts. She notes she had a metallic taste with previous sinus infections. She's been taking Advil cold and sinus as well as using a saline solution and this has helped some with her symptoms.   She does not smoke, drink alcohol, or use other recreational drugs.  Review of Systems Pertinent positives and negatives included in the subjective above.    Objective:   Physical Exam Patient was alert and talking in complete sentences. She looked acutely ill when examined. Ears were normal without erythema and the TMs were not bulging. Throat was non-erythematous and without exudate.  Nasal mucosa was slightly erythematous. Maxillary sinuses were slightly tender to palpation with the right side being more tender than the left. Frontal sinuses were not tender to palpation. Head was not tender to palpation and was without trauma. Heart was tachycardic with regular rhythm without murmurs, rubs, or gallops. Lungs were clear to auscultation bilaterally. Abdomen had normal bowel sounds. It was non-tender to palpation in all four quadrants. There was no rebound tenderness in the right upper quadrant. There was no hepatosplenomegaly on exam.    urine dipstick did show positive and the microscopic did show  multiple crenated red cells. Assessment and Plan:     Acute maxillary sinusitis, recurrence not specified - Plan: CBC with Differential/Platelet, Comprehensive metabolic panel, amoxicillin-clavulanate (AUGMENTIN) 875-125 MG tablet  Fever, unspecified fever cause - Plan: Urinalysis Dipstick  Hematuria, unspecified type  1. Acute Maxillary sinusitis: Patient's presentation is most consistent with sinusitis. Given the severity of her illness and her presentation, we will prescribe augmentin for possible bacterial infection. We will also get a CBC, CMP, and UA to assess for other areas of infection and to be sure there isn't another underlying cause. Patient was advised to go to the ER if she gets worse and to let us know how she's feeling in the next few days.  Patient seen and examined by me and I agree with the above diagnosis. I will follow-up on hematuria after she finishes the antibiotic. Will also wait for lab results before further evaluation and treatment. JL Patient was seen in conjunction with Dr. Susann GivensLalonde. Tessie Fass-Mackenzie Davis, MS3

## 2016-07-14 ENCOUNTER — Encounter: Payer: Self-pay | Admitting: Family Medicine

## 2016-07-14 ENCOUNTER — Ambulatory Visit (INDEPENDENT_AMBULATORY_CARE_PROVIDER_SITE_OTHER): Payer: Medicare Other | Admitting: Family Medicine

## 2016-07-14 VITALS — BP 118/70 | HR 93 | Temp 99.5°F | Wt 149.0 lb

## 2016-07-14 DIAGNOSIS — R319 Hematuria, unspecified: Secondary | ICD-10-CM

## 2016-07-14 DIAGNOSIS — R509 Fever, unspecified: Secondary | ICD-10-CM | POA: Diagnosis not present

## 2016-07-14 DIAGNOSIS — J0100 Acute maxillary sinusitis, unspecified: Secondary | ICD-10-CM

## 2016-07-14 DIAGNOSIS — R195 Other fecal abnormalities: Secondary | ICD-10-CM | POA: Diagnosis not present

## 2016-07-14 LAB — POCT URINALYSIS DIPSTICK
BILIRUBIN UA: NEGATIVE
Glucose, UA: NEGATIVE
Ketones, UA: NEGATIVE
Nitrite, UA: NEGATIVE
PH UA: 6 (ref 5.0–8.0)
RBC UA: POSITIVE
Spec Grav, UA: >=1.03 — AB (ref 1.010–1.025)

## 2016-07-14 LAB — CBC WITH DIFFERENTIAL/PLATELET
Basophils Absolute: 0 cells/uL (ref 0–200)
Basophils Relative: 0 %
Eosinophils Absolute: 0 cells/uL — ABNORMAL LOW (ref 15–500)
Eosinophils Relative: 0 %
HCT: 35.6 % (ref 35.0–45.0)
Hemoglobin: 12.5 g/dL (ref 11.7–15.5)
LYMPHS PCT: 5 %
Lymphs Abs: 290 cells/uL — ABNORMAL LOW (ref 850–3900)
MCH: 32.1 pg (ref 27.0–33.0)
MCHC: 35.1 g/dL (ref 32.0–36.0)
MCV: 91.5 fL (ref 80.0–100.0)
MONOS PCT: 5 %
MPV: 10.1 fL (ref 7.5–12.5)
Monocytes Absolute: 290 cells/uL (ref 200–950)
Neutro Abs: 5220 cells/uL (ref 1500–7800)
Neutrophils Relative %: 90 %
Platelets: 180 10*3/uL (ref 140–400)
RBC: 3.89 MIL/uL (ref 3.80–5.10)
RDW: 12.9 % (ref 11.0–15.0)
WBC: 5.8 10*3/uL (ref 4.0–10.5)

## 2016-07-14 LAB — HEMOCCULT GUIAC POC 1CARD (OFFICE)

## 2016-07-14 NOTE — Progress Notes (Signed)
   Subjective:    Patient ID: Susan Dean, female    DOB: 13-Mar-1945, 71 y.o.   MRN: [id number]  HPI She is here for recheck due to continued difficulty with fever. She has now had 2 days worth of Augmentin. She does state that she is feeling better with no more metallic taste in her mouth, headache is now gone as well as the myalgias. She is still having right lower quadrant discomfort some nausea and one episode of vomiting. She also has had slightly increase in chest congestion and coughing. She also complains of very dark stool but did not describe them as black and tarry   Review of Systems     Objective:   Physical Exam Alert and in no distress and does definitely look better than yesterday. TMs and throat are clear. Neck is supple without adenopathy cardiac exam shows regular rhythm without murmurs or gallops. Lungs clear to auscultation. Abdominal exam shows decreased bowel sounds without masses or tenderness. Stool was brown in color and is guaiac positive.       Assessment & Plan:  Acute maxillary sinusitis, recurrence not specified  Hematuria, unspecified type  Guaiac positive stools  Fever, unspecified fever cause  She will return here in 2 weeks for follow-up on hematuria to make sure that has cleared. She has not had a colonoscopy since 2007 so we will set that up for a couple weeks down the road. Continue to monitor her fever. I think that she might have finally turned the corner and she does look much better than 2 days ago.

## 2016-07-16 LAB — CULTURE, URINE COMPREHENSIVE: ORGANISM ID, BACTERIA: NO GROWTH

## 2016-07-22 ENCOUNTER — Ambulatory Visit (INDEPENDENT_AMBULATORY_CARE_PROVIDER_SITE_OTHER): Payer: Medicare Other | Admitting: Family Medicine

## 2016-07-22 ENCOUNTER — Encounter: Payer: Self-pay | Admitting: Family Medicine

## 2016-07-22 VITALS — BP 120/60 | HR 85 | Wt 152.0 lb

## 2016-07-22 DIAGNOSIS — R509 Fever, unspecified: Secondary | ICD-10-CM

## 2016-07-22 DIAGNOSIS — J0100 Acute maxillary sinusitis, unspecified: Secondary | ICD-10-CM | POA: Diagnosis not present

## 2016-07-22 DIAGNOSIS — R195 Other fecal abnormalities: Secondary | ICD-10-CM | POA: Diagnosis not present

## 2016-07-22 LAB — CBC WITH DIFFERENTIAL/PLATELET
BASOS ABS: 0 {cells}/uL (ref 0–200)
Basophils Relative: 0 %
Eosinophils Absolute: 285 cells/uL (ref 15–500)
Eosinophils Relative: 3 %
HEMATOCRIT: 33.6 % — AB (ref 35.0–45.0)
HEMOGLOBIN: 11.2 g/dL — AB (ref 11.7–15.5)
LYMPHS ABS: 1330 {cells}/uL (ref 850–3900)
Lymphocytes Relative: 14 %
MCH: 31.4 pg (ref 27.0–33.0)
MCHC: 33.3 g/dL (ref 32.0–36.0)
MCV: 94.1 fL (ref 80.0–100.0)
MPV: 9.3 fL (ref 7.5–12.5)
Monocytes Absolute: 855 cells/uL (ref 200–950)
Monocytes Relative: 9 %
NEUTROS PCT: 74 %
Neutro Abs: 7030 cells/uL (ref 1500–7800)
Platelets: 406 10*3/uL — ABNORMAL HIGH (ref 140–400)
RBC: 3.57 MIL/uL — ABNORMAL LOW (ref 3.80–5.10)
RDW: 13.9 % (ref 11.0–15.0)
WBC: 9.5 10*3/uL (ref 4.0–10.5)

## 2016-07-22 NOTE — Progress Notes (Signed)
   Subjective:    Patient ID: Susan Dean, female    DOB: 1945/09/30, 71 y.o.   MRN: [id number]  HPI She is here for a recheck. She does state that she is feeling 80% better and thinks the Augmentin did indeed help get rid of the metallic taste in her mouth. She still does complain of some fatigue with postnasal drainage and did have a slight episode of dry cough yesterday. Some chest tightness. She has been taking Daniel GERD. Apparently the Augmentin did cause some GI distress. She has not set up an appointment with GI to follow-up on the guaiac-positive stools.  Review of Systems     Objective:   Physical Exam Alert and in no distress. Tympanic membranes and canals are normal. Pharyngeal area is normal. Neck is supple without adenopathy or thyromegaly. Cardiac exam shows a regular sinus rhythm without murmurs or gallops. Lungs are clear to auscultation.        Assessment & Plan:  Acute maxillary sinusitis, recurrence not specified - Plan: CBC with Differential/Platelet, Comprehensive metabolic panel  Guaiac positive stools  Fever, unspecified fever cause - Plan: CBC with Differential/Platelet, Comprehensive metabolic panel I will do blood work and see how she does over the next week. If she continues to improve, no further intervention necessary. If she gets worse or remains status quo for the next week I will probably switch her to Levaquin. Recommend that she get a GI evaluation after this episode has quieted down and she is no longer having any GI problems. Also recommend continued use of probiotic.

## 2016-07-23 LAB — COMPREHENSIVE METABOLIC PANEL
ALBUMIN: 3 g/dL — AB (ref 3.6–5.1)
ALT: 49 U/L — ABNORMAL HIGH (ref 6–29)
AST: 28 U/L (ref 10–35)
Alkaline Phosphatase: 95 U/L (ref 33–130)
BUN: 18 mg/dL (ref 7–25)
CALCIUM: 9.2 mg/dL (ref 8.6–10.4)
CHLORIDE: 108 mmol/L (ref 98–110)
CO2: 23 mmol/L (ref 20–31)
Creat: 0.86 mg/dL (ref 0.60–0.93)
Glucose, Bld: 97 mg/dL (ref 65–99)
Potassium: 4.1 mmol/L (ref 3.5–5.3)
SODIUM: 144 mmol/L (ref 135–146)
Total Bilirubin: 0.4 mg/dL (ref 0.2–1.2)
Total Protein: 5.4 g/dL — ABNORMAL LOW (ref 6.1–8.1)

## 2016-07-24 ENCOUNTER — Encounter: Payer: Self-pay | Admitting: Family Medicine

## 2016-07-24 NOTE — Progress Notes (Signed)
The blood work shows a lower Hb and albumin which could be nutrition related so will re check on one week. Info relayed to her

## 2016-07-25 ENCOUNTER — Ambulatory Visit: Payer: Medicare Other | Admitting: Family Medicine

## 2016-08-01 ENCOUNTER — Ambulatory Visit (INDEPENDENT_AMBULATORY_CARE_PROVIDER_SITE_OTHER): Payer: Medicare Other | Admitting: Family Medicine

## 2016-08-01 DIAGNOSIS — E8809 Other disorders of plasma-protein metabolism, not elsewhere classified: Secondary | ICD-10-CM | POA: Diagnosis not present

## 2016-08-01 DIAGNOSIS — D649 Anemia, unspecified: Secondary | ICD-10-CM | POA: Diagnosis not present

## 2016-08-01 LAB — CBC WITH DIFFERENTIAL/PLATELET
Basophils Absolute: 40 cells/uL (ref 0–200)
Basophils Relative: 1 %
EOS ABS: 80 {cells}/uL (ref 15–500)
Eosinophils Relative: 2 %
HEMATOCRIT: 33 % — AB (ref 35.0–45.0)
Hemoglobin: 10.9 g/dL — ABNORMAL LOW (ref 11.7–15.5)
LYMPHS PCT: 32 %
Lymphs Abs: 1280 cells/uL (ref 850–3900)
MCH: 31.7 pg (ref 27.0–33.0)
MCHC: 33 g/dL (ref 32.0–36.0)
MCV: 95.9 fL (ref 80.0–100.0)
MPV: 9.6 fL (ref 7.5–12.5)
Monocytes Absolute: 320 cells/uL (ref 200–950)
Monocytes Relative: 8 %
NEUTROS PCT: 57 %
Neutro Abs: 2280 cells/uL (ref 1500–7800)
Platelets: 285 10*3/uL (ref 140–400)
RBC: 3.44 MIL/uL — ABNORMAL LOW (ref 3.80–5.10)
RDW: 14.4 % (ref 11.0–15.0)
WBC: 4 10*3/uL (ref 4.0–10.5)

## 2016-08-01 LAB — COMPREHENSIVE METABOLIC PANEL
ALT: 16 U/L (ref 6–29)
AST: 16 U/L (ref 10–35)
Albumin: 3.6 g/dL (ref 3.6–5.1)
Alkaline Phosphatase: 82 U/L (ref 33–130)
BUN: 18 mg/dL (ref 7–25)
CHLORIDE: 106 mmol/L (ref 98–110)
CO2: 28 mmol/L (ref 20–31)
Calcium: 9.2 mg/dL (ref 8.6–10.4)
Creat: 0.91 mg/dL (ref 0.60–0.93)
GLUCOSE: 105 mg/dL — AB (ref 65–99)
POTASSIUM: 3.9 mmol/L (ref 3.5–5.3)
Sodium: 142 mmol/L (ref 135–146)
Total Bilirubin: 0.4 mg/dL (ref 0.2–1.2)
Total Protein: 6.2 g/dL (ref 6.1–8.1)

## 2016-08-01 NOTE — Progress Notes (Signed)
   Subjective:    Patient ID: Susan Dean, female    DOB: [birth year], 71 y.o.   MRN: [id number]  HPI She is here for a recheck. She states that she is feeling better but still having some decrease in her stamina as well as strength and does have a little bit of chest congestion a day. She blames it on over the weekend being in the pool. No fever, chills, sore throat, shortness of breath. Previous blood work did show a slightly low hemoglobin as well as total protein.   Review of Systems     Objective:   Physical Exam Alert and in no distress. Tympanic membranes and canals are normal. Pharyngeal area is normal. Neck is supple without adenopathy or thyromegaly. Cardiac exam shows a regular sinus rhythm without murmurs or gallops. Lungs are clear to auscultation.       Assessment & Plan:  Anemia, unspecified type - Plan: CBC with Differential/Platelet  Proteins serum plasma low - Plan: Comprehensive metabolic panel Will await results on this. She does have a previous history of guaiac-positive stool and GI referral will be made. We have held off on this due to her recent illness.

## 2016-08-02 ENCOUNTER — Other Ambulatory Visit: Payer: Self-pay

## 2016-08-02 DIAGNOSIS — Z1211 Encounter for screening for malignant neoplasm of colon: Secondary | ICD-10-CM

## 2016-08-04 ENCOUNTER — Encounter: Payer: Self-pay | Admitting: Internal Medicine

## 2016-09-05 ENCOUNTER — Encounter: Payer: Self-pay | Admitting: Family Medicine

## 2016-09-13 DIAGNOSIS — H5213 Myopia, bilateral: Secondary | ICD-10-CM | POA: Diagnosis not present

## 2016-09-22 ENCOUNTER — Ambulatory Visit
Admission: RE | Admit: 2016-09-22 | Discharge: 2016-09-22 | Disposition: A | Discharge status: Home / Self Care | Payer: Medicare Other | Source: Ambulatory Visit | Attending: Family Medicine | Admitting: Family Medicine

## 2016-09-22 ENCOUNTER — Encounter: Payer: Self-pay | Admitting: Family Medicine

## 2016-09-22 ENCOUNTER — Ambulatory Visit (INDEPENDENT_AMBULATORY_CARE_PROVIDER_SITE_OTHER): Payer: Medicare Other | Admitting: Family Medicine

## 2016-09-22 VITALS — BP 120/70 | HR 70 | Wt 151.0 lb

## 2016-09-22 DIAGNOSIS — M25561 Pain in right knee: Secondary | ICD-10-CM | POA: Diagnosis not present

## 2016-09-22 DIAGNOSIS — D649 Anemia, unspecified: Secondary | ICD-10-CM | POA: Diagnosis not present

## 2016-09-22 DIAGNOSIS — M25461 Effusion, right knee: Secondary | ICD-10-CM | POA: Diagnosis not present

## 2016-09-22 LAB — CBC WITH DIFFERENTIAL/PLATELET
BASOS ABS: 0 {cells}/uL (ref 0–200)
Basophils Relative: 0 %
Eosinophils Absolute: 104 cells/uL (ref 15–500)
Eosinophils Relative: 2 %
HEMATOCRIT: 36.3 % (ref 35.0–45.0)
HEMOGLOBIN: 12.2 g/dL (ref 11.7–15.5)
LYMPHS ABS: 1508 {cells}/uL (ref 850–3900)
Lymphocytes Relative: 29 %
MCH: 32.4 pg (ref 27.0–33.0)
MCHC: 33.6 g/dL (ref 32.0–36.0)
MCV: 96.3 fL (ref 80.0–100.0)
MPV: 9.3 fL (ref 7.5–12.5)
Monocytes Absolute: 416 cells/uL (ref 200–950)
Monocytes Relative: 8 %
NEUTROS ABS: 3172 {cells}/uL (ref 1500–7800)
NEUTROS PCT: 61 %
Platelets: 265 10*3/uL (ref 140–400)
RBC: 3.77 MIL/uL — ABNORMAL LOW (ref 3.80–5.10)
RDW: 13.8 % (ref 11.0–15.0)
WBC: 5.2 10*3/uL (ref 4.0–10.5)
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
## 2016-10-03 ENCOUNTER — Encounter: Payer: Self-pay | Admitting: Family Medicine

## 2016-11-01 ENCOUNTER — Other Ambulatory Visit (INDEPENDENT_AMBULATORY_CARE_PROVIDER_SITE_OTHER): Payer: Medicare Other

## 2016-11-01 DIAGNOSIS — Z23 Encounter for immunization: Secondary | ICD-10-CM | POA: Diagnosis not present

## 2017-02-02 ENCOUNTER — Other Ambulatory Visit (INDEPENDENT_AMBULATORY_CARE_PROVIDER_SITE_OTHER): Payer: Medicare Other

## 2017-02-02 DIAGNOSIS — R3911 Hesitancy of micturition: Secondary | ICD-10-CM | POA: Diagnosis not present

## 2017-02-02 LAB — POCT URINALYSIS DIP (PROADVANTAGE DEVICE)
Bilirubin, UA: NEGATIVE
Glucose, UA: NEGATIVE mg/dL
Ketones, POC UA: NEGATIVE mg/dL
Nitrite, UA: NEGATIVE
PH UA: 6 (ref 5.0–8.0)
PROTEIN UA: NEGATIVE mg/dL
SPECIFIC GRAVITY, URINE: 1.025
UUROB: NEGATIVE

## 2017-02-04 ENCOUNTER — Other Ambulatory Visit: Payer: Self-pay | Admitting: Family Medicine

## 2017-02-04 MED ORDER — SULFAMETHOXAZOLE-TRIMETHOPRIM 800-160 MG PO TABS: 1.0000 | ORAL_TABLET | Freq: Two times a day (BID) | ORAL | 0 refills | Status: AC

## 2017-07-27 ENCOUNTER — Ambulatory Visit: Payer: Medicare Other | Admitting: Family Medicine

## 2017-07-27 ENCOUNTER — Encounter: Payer: Self-pay | Admitting: Family Medicine

## 2017-07-27 VITALS — BP 138/70 | HR 84 | Temp 98.1°F | Wt 152.0 lb

## 2017-07-27 DIAGNOSIS — Z1211 Encounter for screening for malignant neoplasm of colon: Secondary | ICD-10-CM

## 2017-07-27 DIAGNOSIS — Z1231 Encounter for screening mammogram for malignant neoplasm of breast: Secondary | ICD-10-CM

## 2017-07-27 DIAGNOSIS — Z1239 Encounter for other screening for malignant neoplasm of breast: Secondary | ICD-10-CM

## 2017-07-27 DIAGNOSIS — M25511 Pain in right shoulder: Secondary | ICD-10-CM

## 2017-07-27 LAB — CBC WITH DIFFERENTIAL/PLATELET
BASOS ABS: 0 10*3/uL (ref 0.0–0.2)
Basos: 0 %
EOS (ABSOLUTE): 0 10*3/uL (ref 0.0–0.4)
EOS: 1 %
HEMATOCRIT: 36.6 % (ref 34.0–46.6)
Hemoglobin: 12.4 g/dL (ref 11.1–15.9)
IMMATURE GRANULOCYTES: 0 %
Immature Grans (Abs): 0 10*3/uL (ref 0.0–0.1)
LYMPHS ABS: 1 10*3/uL (ref 0.7–3.1)
Lymphs: 13 %
MCH: 32 pg (ref 26.6–33.0)
MCHC: 33.9 g/dL (ref 31.5–35.7)
MCV: 95 fL (ref 79–97)
Monocytes Absolute: 0.7 10*3/uL (ref 0.1–0.9)
Monocytes: 9 %
NEUTROS PCT: 77 %
Neutrophils Absolute: 5.7 10*3/uL (ref 1.4–7.0)
Platelets: 238 10*3/uL (ref 150–450)
RBC: 3.87 x10E6/uL (ref 3.77–5.28)
RDW: 13.5 % (ref 12.3–15.4)
WBC: 7.5 10*3/uL (ref 3.4–10.8)

## 2017-07-27 LAB — COMPREHENSIVE METABOLIC PANEL
ALK PHOS: 68 IU/L (ref 39–117)
ALT: 13 IU/L (ref 0–32)
AST: 20 IU/L (ref 0–40)
Albumin/Globulin Ratio: 1.8 (ref 1.2–2.2)
Albumin: 4.2 g/dL (ref 3.5–4.8)
BUN/Creatinine Ratio: 15 (ref 12–28)
BUN: 15 mg/dL (ref 8–27)
Bilirubin Total: 0.6 mg/dL (ref 0.0–1.2)
CALCIUM: 9.3 mg/dL (ref 8.7–10.3)
CO2: 23 mmol/L (ref 20–29)
CREATININE: 1.03 mg/dL — AB (ref 0.57–1.00)
Chloride: 107 mmol/L — ABNORMAL HIGH (ref 96–106)
GFR calc Af Amer: 62 mL/min/{1.73_m2} (ref >59)
GFR, EST NON AFRICAN AMERICAN: 54 mL/min/{1.73_m2} — AB (ref >59)
GLUCOSE: 98 mg/dL (ref 65–99)
Globulin, Total: 2.3 g/dL (ref 1.5–4.5)
Potassium: 4.3 mmol/L (ref 3.5–5.2)
Sodium: 143 mmol/L (ref 134–144)
Total Protein: 6.5 g/dL (ref 6.0–8.5)

## 2017-07-27 MED ORDER — HYDROCODONE-ACETAMINOPHEN 5-325 MG PO TABS: 1.0000 | ORAL_TABLET | Freq: Four times a day (QID) | ORAL | 0 refills | Status: AC | PRN

## 2017-07-27 NOTE — Patient Instructions (Signed)
You can take 800 mg of ibuprofen 3 times per day which should be 4 of the 200 mg pills.  You can mix that with Norco every 4 hours

## 2017-07-27 NOTE — Progress Notes (Addendum)
   Subjective:    Patient ID: Modesta MessingLana B Brewbaker, female    DOB: 16-Oct-1945, 72 y.o.   MRN: [id number]  HPI She is here for evaluation of right shoulder pain.  On Tuesday of this week she abducted and slightly externally rotated her arm to put it up on the backseat of a car and experienced excruciating right shoulder pain.  No history of recent injury or overuse.  No other joints are involved.  No numbness, tingling.  Motion in any direction causes pain.  This kept her awake last night. Review of the record indicates she has not followed up with getting a colonoscopy or a mammogram.  She has also not had a DEXA scan for  Review of Systems     Objective:   Physical Exam Alert and holding the right shoulder close to her body.  Motion of the arm causes pain.  Abduction was also uncomfortable.  No palpable tenderness.  Negative sulcus sign.  Unable to do a full exam due to the pain.       Assessment & Plan:  Acute pain of right shoulder - Plan: DG Shoulder Right, CBC with Differential/Platelet, Comprehensive metabolic panel, HYDROcodone-acetaminophen (NORCO) 5-325 MG tablet  Screening for breast cancer - Plan: MM DIGITAL SCREENING BILATERAL  Screening for colon cancer - Plan: Cologuard  I will get her caught up on all of her screenings.  The amount of pain that she is having is significant and could indicate an underlying major problem.  07/31/17 I called to review the x-ray and blood work with her.  She states that she is now 90% better and starting to do some range of motion exercises.  Suggested that she keep this up and if she runs into trouble, we can get her into physical therapy.  Also discussed possible orthopedic referral.

## 2017-07-28 ENCOUNTER — Ambulatory Visit
Admission: RE | Admit: 2017-07-28 | Discharge: 2017-07-28 | Disposition: A | Discharge status: Home / Self Care | Payer: Medicare Other | Source: Ambulatory Visit | Attending: Family Medicine | Admitting: Family Medicine

## 2017-07-28 DIAGNOSIS — M25511 Pain in right shoulder: Secondary | ICD-10-CM

## 2017-07-28 DIAGNOSIS — M19011 Primary osteoarthritis, right shoulder: Secondary | ICD-10-CM | POA: Diagnosis not present

## 2017-08-11 ENCOUNTER — Telehealth: Payer: Self-pay | Admitting: Family Medicine

## 2017-08-11 NOTE — Telephone Encounter (Signed)
Called pt to schedule a Medicare Well Visit. She will review her schedule and call back later to schedule the appointment.

## 2017-08-14 DIAGNOSIS — Z0000 Encounter for general adult medical examination without abnormal findings: Secondary | ICD-10-CM | POA: Diagnosis not present

## 2017-11-08 ENCOUNTER — Other Ambulatory Visit (INDEPENDENT_AMBULATORY_CARE_PROVIDER_SITE_OTHER): Payer: Medicare Other

## 2017-11-08 DIAGNOSIS — Z23 Encounter for immunization: Secondary | ICD-10-CM | POA: Diagnosis not present

## 2017-12-07 ENCOUNTER — Ambulatory Visit: Payer: Medicare Other | Admitting: Family Medicine
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
## 2018-11-30 ENCOUNTER — Encounter: Payer: Self-pay | Admitting: Family Medicine

## 2018-12-17 ENCOUNTER — Other Ambulatory Visit (INDEPENDENT_AMBULATORY_CARE_PROVIDER_SITE_OTHER): Payer: Medicare Other

## 2018-12-17 ENCOUNTER — Other Ambulatory Visit: Payer: Self-pay

## 2018-12-17 DIAGNOSIS — Z23 Encounter for immunization: Secondary | ICD-10-CM

## 2019-04-01 LAB — COLOGUARD: Cologuard: NEGATIVE

## 2019-04-16 NOTE — Progress Notes (Signed)
Pt was advised of negative cologuard. KH 

## 2019-04-24 ENCOUNTER — Encounter: Payer: Self-pay | Admitting: Family Medicine

## 2019-05-15 DIAGNOSIS — D3130 Benign neoplasm of unspecified choroid: Secondary | ICD-10-CM | POA: Diagnosis not present

## 2019-08-21 ENCOUNTER — Ambulatory Visit: Payer: Medicare Other | Admitting: Family Medicine

## 2019-08-21 ENCOUNTER — Other Ambulatory Visit: Payer: Self-pay

## 2019-08-21 ENCOUNTER — Telehealth: Payer: Self-pay | Admitting: Family Medicine

## 2019-08-21 ENCOUNTER — Encounter: Payer: Self-pay | Admitting: Family Medicine

## 2019-08-21 VITALS — BP 148/86 | HR 84 | Temp 98.4°F | Wt 148.8 lb

## 2019-08-21 DIAGNOSIS — L237 Allergic contact dermatitis due to plants, except food: Secondary | ICD-10-CM | POA: Diagnosis not present

## 2019-08-21 MED ORDER — PREDNISONE 10 MG (21) PO TBPK: ORAL_TABLET | ORAL | 0 refills | Status: DC
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
## 2019-08-21 NOTE — Telephone Encounter (Signed)
Have her schedule a visit

## 2019-08-22 MED ORDER — METHYLPREDNISOLONE SODIUM SUCC 125 MG IJ SOLR: 125.0000 mg | Freq: Once | INTRAMUSCULAR | Status: DC

## 2019-08-22 NOTE — Addendum Note (Signed)
Addended by: Renelda Loma on: 08/22/2019 09:00 AM   Modules accepted: Orders

## 2019-09-03 DIAGNOSIS — S81811A Laceration without foreign body, right lower leg, initial encounter: Secondary | ICD-10-CM | POA: Diagnosis not present

## 2019-11-18 ENCOUNTER — Other Ambulatory Visit: Payer: Self-pay

## 2019-11-18 ENCOUNTER — Other Ambulatory Visit (INDEPENDENT_AMBULATORY_CARE_PROVIDER_SITE_OTHER): Payer: Medicare Other

## 2019-11-18 DIAGNOSIS — Z23 Encounter for immunization: Secondary | ICD-10-CM | POA: Diagnosis not present

## 2020-02-12 ENCOUNTER — Ambulatory Visit: Payer: Medicare Other

## 2020-10-13 ENCOUNTER — Other Ambulatory Visit: Payer: Self-pay

## 2020-10-13 ENCOUNTER — Ambulatory Visit (INDEPENDENT_AMBULATORY_CARE_PROVIDER_SITE_OTHER): Payer: Medicare Other

## 2020-10-13 DIAGNOSIS — Z23 Encounter for immunization: Secondary | ICD-10-CM | POA: Diagnosis not present

## 2020-11-06 ENCOUNTER — Telehealth: Payer: Self-pay | Admitting: Family Medicine

## 2020-11-06 NOTE — Telephone Encounter (Signed)
Error

## 2020-11-17 ENCOUNTER — Other Ambulatory Visit: Payer: Self-pay

## 2020-11-17 ENCOUNTER — Encounter: Payer: Self-pay | Admitting: Family Medicine

## 2020-11-17 ENCOUNTER — Telehealth (INDEPENDENT_AMBULATORY_CARE_PROVIDER_SITE_OTHER): Payer: Medicare Other | Admitting: Family Medicine

## 2020-11-17 VITALS — Temp 98.3°F | Wt 144.0 lb

## 2020-11-17 DIAGNOSIS — J019 Acute sinusitis, unspecified: Secondary | ICD-10-CM

## 2020-11-17 MED ORDER — AMOXICILLIN 875 MG PO TABS: 875.0000 mg | ORAL_TABLET | Freq: Two times a day (BID) | ORAL | 0 refills | Status: DC

## 2020-11-17 NOTE — Progress Notes (Signed)
   Subjective:    Patient ID: Susan Dean, female    DOB: [birth year], 75 y.o.   MRN: [id number]  HPI Documentation for virtual audio and video telecommunications through Caregility encounter: The patient was located at home. 2 patient identifiers used.  The provider was located in the office. The patient did consent to this visit and is aware of possible charges through their insurance for this visit. The other persons participating in this telemedicine service were none. Time spent on call was 2 minutes and in review of previous records >15 minutes total for counseling and coordination of care. This virtual service is not related to other E/M service within previous 7 days.  She was in her usual state of health until yesterday when she developed sore throat purulent postnasal drainage, hoarse voice and dry cough that has become slightly productive as well as purulent rhinorrhea.  No fever, chills, earache  Review of Systems     Objective:   Physical Exam Alert and in no distress with a hoarse voice.       Assessment & Plan:  Acute sinusitis, recurrence not specified, unspecified location - Plan: amoxicillin (AMOXIL) 875 MG tablet At the end the encounter she mentions something about getting swollen eyes with the Amoxil.  I explained that that is probably not a true allergic reaction.  She will keep me informed.

## 2020-12-17 ENCOUNTER — Telehealth: Payer: Self-pay | Admitting: Family Medicine

## 2020-12-17 NOTE — Telephone Encounter (Signed)
Left message for patient to call back  [phone] to schedule Medicare Annual Wellness Visit (AWV) either virtually or in office.   Last AWV ;01/13/16  please schedule at anytime with health coach  This should be a 45 minute visit.

## 2021-01-19 ENCOUNTER — Telehealth: Payer: Self-pay | Admitting: Family Medicine

## 2021-01-19 NOTE — Telephone Encounter (Signed)
Spoke with patient to schedule Medicare Annual Wellness Visit (AWV) either virtually or in office.  Patient stated she didn't have time right now to schedule   Last AWV 01/13/16 ; please schedule at anytime with health coach  This should be a 45 minute visit.

## 2021-03-02 DIAGNOSIS — N3000 Acute cystitis without hematuria: Secondary | ICD-10-CM | POA: Diagnosis not present

## 2021-04-13 ENCOUNTER — Telehealth: Payer: Self-pay | Admitting: Family Medicine

## 2021-04-13 NOTE — Telephone Encounter (Signed)
Left message for patient to call back and schedule Medicare Annual Wellness Visit (AWV) either virtually or in office. ?I left my number for patient to call [phone]. ? ?Last AWV 01/13/16 ?please schedule at anytime with health coach ? ?This should be a 45 minute visit.   ?

## 2021-04-27 ENCOUNTER — Telehealth: Payer: Self-pay | Admitting: Family Medicine

## 2021-04-27 NOTE — Telephone Encounter (Signed)
Left message for patient to call back and schedule Medicare Annual Wellness Visit (AWV) either virtually or in office. ?I left my number for patient to call [phone]. ? ?Last AWV 01/13/16 ?please schedule at anytime with health coach ? ?This should be a 45 minute visit.   ?

## 2021-05-31 ENCOUNTER — Telehealth: Payer: Self-pay | Admitting: Family Medicine

## 2021-05-31 NOTE — Telephone Encounter (Signed)
Spoke with patient to schedule Medicare Annual Wellness Visit (AWV) either virtually or in office. ?Patient stated she would call back to schedule  ? ?Last AWV ;12/11/15 ? please schedule at anytime with health coach ? ? ?

## 2021-07-19 ENCOUNTER — Telehealth: Payer: Self-pay | Admitting: Family Medicine

## 2021-07-19 NOTE — Telephone Encounter (Signed)
Left message for patient to call back and schedule Medicare Annual Wellness Visit (AWV) either virtually or in office. I left my number for patient to call [phone].  Last AWV ;01/13/16  please schedule at anytime with health coach   

## 2021-09-06 ENCOUNTER — Telehealth: Payer: Self-pay | Admitting: Family Medicine

## 2021-09-06 NOTE — Telephone Encounter (Signed)
Left message for patient to call back and schedule Medicare Annual Wellness Visit (AWV) either virtually or in office. I left my number for patient to call [phone].  Last AWV ;01/13/16  please schedule at anytime with health coach

## 2021-10-21 ENCOUNTER — Telehealth: Payer: Self-pay | Admitting: Family Medicine

## 2021-10-21 NOTE — Telephone Encounter (Signed)
Left message for patient to call back and schedule Medicare Annual Wellness Visit (AWV) either virtually or in office. I left my number for patient to call [phone].  Last AWV ;01/13/16  please schedule at anytime with health coach

## 2021-12-08 ENCOUNTER — Telehealth: Payer: Self-pay | Admitting: Family Medicine

## 2021-12-08 NOTE — Telephone Encounter (Signed)
Left message for patient to call back and schedule Medicare Annual Wellness Visit (AWV) either virtually or in office. Left  my Susan Dean number [phone]   Last AWV  01/13/16 please schedule with Nurse Health Adviser   45 min for awv-i and in office appointments 30 min for awv-s  phone/virtual appointments

## 2022-04-04 ENCOUNTER — Encounter: Payer: Self-pay | Admitting: Nurse Practitioner

## 2022-04-04 ENCOUNTER — Telehealth (INDEPENDENT_AMBULATORY_CARE_PROVIDER_SITE_OTHER): Payer: Medicare Other | Admitting: Nurse Practitioner

## 2022-04-04 VITALS — Temp 97.8°F | Ht 61.0 in | Wt 129.0 lb

## 2022-04-04 DIAGNOSIS — J019 Acute sinusitis, unspecified: Secondary | ICD-10-CM | POA: Diagnosis not present

## 2022-04-04 DIAGNOSIS — J040 Acute laryngitis: Secondary | ICD-10-CM | POA: Diagnosis not present

## 2022-04-04 MED ORDER — AMOXICILLIN 875 MG PO TABS: 875.0000 mg | ORAL_TABLET | Freq: Two times a day (BID) | ORAL | 0 refills | Status: DC

## 2022-04-04 MED ORDER — PREDNISONE 20 MG PO TABS: 20.0000 mg | ORAL_TABLET | Freq: Every day | ORAL | 0 refills | Status: AC

## 2022-04-04 NOTE — Patient Instructions (Addendum)
I have sent in amoxicillin for you to take twice a day. This should help you start to feel better within the next 1-2 days.   I have also sent in prednisone to help with inflammation in your sinuses. Take this in the morning as it can give you energy and could keep you up at night.   Continue to use warm honey and tea to help soothe your throat. Try to rest your voice as much as possible. Warm salt water gargles can also help soothe the throat.   You can take 1 advil cold and sinus every 6 hours. Watch your blood pressure, if it looks like it is creeping up, then switch to coricidin.   Let us know if your symptoms are not improving by the end of the week.

## 2022-04-04 NOTE — Assessment & Plan Note (Signed)
Patient reports a frog-like voice and throat irritation due to sinus drainage. Suspect drainage and swelling of the vocal cords is present. No breathing difficulties noted.  Plan: - Recommend warm tea with honey and gargling salt water for throat soothing. - Suggest using mouthwash regularly. - Patient wondering if a mixture of one-third honey, one-third orange juice, and one-third liqueur can be used to alleviate throat discomfort. In moderation, this is acceptable.

## 2022-04-04 NOTE — Progress Notes (Signed)
Virtual Visit Encounter telephone visit.  Patient unable to connect to video, therefore converted to telephone visit.    I connected with  Susan Dean on 04/04/22 at  4:00 PM EST by secure audio telemedicine application. I verified that I am speaking with the correct person using two identifiers.   I introduced myself as a Designer, jewellery with the practice. The limitations of evaluation and management by telemedicine discussed with the patient and the availability of in person appointments. The patient expressed verbal understanding and consent to proceed.  Participating parties in this visit include: Myself and patient  The patient is: Patient Location: Home I am: Provider Location: Office/Clinic Subjective:    CC and HPI: Susan Dean is a 77 y.o. year old female presenting for new evaluation and treatment of sinusitis. Patient reports the following: Susan Dean presents today with concerns primarily centered around symptoms of a sinus infection.  She reports a chief complaint of drainage and irritation in her larynx, which has resulted in a frog-like voice persisting for several weeks. Additionally, Susan Dean has been experiencing coughing episodes that occur nightly, lasting from approximately 11:30 PM to 4 AM over the past three nights.  Susan Dean has been managing her symptoms with Advil Cold & Sinus, Equate Robitussin DM Max, and soothing her throat with hot tea with lemon and honey.  She describes experiencing pain and pressure under her eyes. She has not had a fever.   She tells me she has not had a sinus infection this bad in about 4 years.    Past medical history, Surgical history, Family history not pertinant except as noted below, Social history, Allergies, and medications have been entered into the medical record, reviewed, and corrections made.   Review of Systems:  All review of systems negative except what is listed in the HPI  Objective:    Alert and oriented x 4 Hoarse  voice No shortness of breath. No distress.  Impression and Recommendations:    Problem List Items Addressed This Visit     Acute sinusitis - Primary    Symptoms include sinus drainage, cough, and facial pain under the eyes for the past 5 days. OTC medications have not been effective in managing her symptoms and they have progressively worsened.  Plan: - Prescribe amoxicillin 500 mg BID times daily for seven days. If symptoms resolve, instruct the patient to stop amoxicillin after five days. - Advise the patient to continue taking Equate Robitussin DM Max as needed for cough relief, may also take one Advil Cold & Sinus every six hours to help dry up the sinuses. If high blood pressure develops, switch to coricidin.  - Follow-up if symptoms       Relevant Medications   amoxicillin (AMOXIL) 875 MG tablet   predniSONE (DELTASONE) 20 MG tablet   Laryngitis    Patient reports a frog-like voice and throat irritation due to sinus drainage. Suspect drainage and swelling of the vocal cords is present. No breathing difficulties noted.  Plan: - Recommend warm tea with honey and gargling salt water for throat soothing. - Suggest using mouthwash regularly. - Patient wondering if a mixture of one-third honey, one-third orange juice, and one-third liqueur can be used to alleviate throat discomfort. In moderation, this is acceptable.        orders and follow up as documented in EMR I discussed the assessment and treatment plan with the patient. The patient was provided an opportunity to ask questions and all were answered. The patient  agreed with the plan and demonstrated an understanding of the instructions.   The patient was advised to call back or seek an in-person evaluation if the symptoms worsen or if the condition fails to improve as anticipated.  Follow-Up: prn  I provided 21 minutes of non-face-to-face interaction with this non face-to-face encounter including intake, same-day  documentation, and chart review.   Orma Render, NP , DNP, AGNP-c [facility]

## 2022-04-04 NOTE — Assessment & Plan Note (Addendum)
Symptoms include sinus drainage, cough, and facial pain under the eyes for the past 5 days. OTC medications have not been effective in managing her symptoms and they have progressively worsened.  Plan: - Prescribe amoxicillin 500 mg BID times daily for seven days. If symptoms resolve, instruct the patient to stop amoxicillin after five days. - Advise the patient to continue taking Equate Robitussin DM Max as needed for cough relief, may also take one Advil Cold & Sinus every six hours to help dry up the sinuses. If high blood pressure develops, switch to coricidin.  - Follow-up if symptoms

## 2022-04-13 ENCOUNTER — Telehealth: Payer: Self-pay | Admitting: Family Medicine

## 2022-04-13 NOTE — Telephone Encounter (Signed)
Pt called and states she had a virtual with Clarise Cruz and was given amoxicillin. She states she has one more dose and feels like she needs another round. She states that sinuses are clearing up but not clear. Pt uses [facility] on [address]. PT can be reached at [phone].

## 2022-04-14 ENCOUNTER — Other Ambulatory Visit: Payer: Self-pay | Admitting: Nurse Practitioner

## 2022-04-14 DIAGNOSIS — J019 Acute sinusitis, unspecified: Secondary | ICD-10-CM

## 2022-04-14 MED ORDER — AMOXICILLIN 875 MG PO TABS: 875.0000 mg | ORAL_TABLET | Freq: Two times a day (BID) | ORAL | 0 refills | Status: AC

## 2022-04-14 NOTE — Telephone Encounter (Signed)
Pt called in again and states today is the last day for her to take amoxicillin and she feels like she needs another round, she isn't 100% better and thinks if she stops now it may come back and get worse. Pt uses [facility] on [address].

## 2022-04-26 NOTE — Telephone Encounter (Signed)
Previous message sent
